# Patient Record
Sex: Male | Born: 2000 | Race: White | Hispanic: Yes | Marital: Single | Smoking: Never smoker
Health system: Southern US, Community
[De-identification: ages and names within clinical notes are randomized; demographics above are authoritative.]

## PROBLEM LIST (undated history)

## (undated) DIAGNOSIS — F419 Anxiety disorder, unspecified: Secondary | ICD-10-CM

## (undated) DIAGNOSIS — F840 Autistic disorder: Secondary | ICD-10-CM

## (undated) DIAGNOSIS — Z8701 Personal history of pneumonia (recurrent): Secondary | ICD-10-CM

## (undated) DIAGNOSIS — F32A Depression, unspecified: Secondary | ICD-10-CM

## (undated) HISTORY — DX: Autistic disorder: F84.0

## (undated) HISTORY — DX: Depression, unspecified: F32.A

## (undated) HISTORY — DX: Anxiety disorder, unspecified: F41.9

---
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
## 2020-09-11 ENCOUNTER — Encounter: Payer: BC Managed Care – PPO | Admitting: Internal Medicine

## 2020-10-07 ENCOUNTER — Encounter: Payer: Self-pay | Admitting: Internal Medicine

## 2021-03-18 ENCOUNTER — Ambulatory Visit (INDEPENDENT_AMBULATORY_CARE_PROVIDER_SITE_OTHER): Payer: BC Managed Care – PPO | Admitting: Internal Medicine

## 2021-03-18 ENCOUNTER — Encounter: Payer: Self-pay | Admitting: Internal Medicine

## 2021-03-18 ENCOUNTER — Other Ambulatory Visit: Payer: Self-pay

## 2021-03-18 VITALS — BP 116/70 | HR 65 | Temp 98.5°F | Ht 69.67 in | Wt 203.0 lb

## 2021-03-18 DIAGNOSIS — Z0000 Encounter for general adult medical examination without abnormal findings: Secondary | ICD-10-CM | POA: Diagnosis not present

## 2021-03-18 DIAGNOSIS — F840 Autistic disorder: Secondary | ICD-10-CM

## 2021-03-18 DIAGNOSIS — I861 Scrotal varices: Secondary | ICD-10-CM

## 2021-03-18 DIAGNOSIS — E559 Vitamin D deficiency, unspecified: Secondary | ICD-10-CM | POA: Diagnosis not present

## 2021-03-18 DIAGNOSIS — J301 Allergic rhinitis due to pollen: Secondary | ICD-10-CM

## 2021-03-18 LAB — CBC WITH DIFFERENTIAL/PLATELET
Basophils Absolute: 0 10*3/uL (ref 0.0–0.1)
Basophils Relative: 0.6 % (ref 0.0–3.0)
Eosinophils Absolute: 0.2 10*3/uL (ref 0.0–0.7)
Eosinophils Relative: 4 % (ref 0.0–5.0)
HCT: 43.1 % (ref 39.0–52.0)
Hemoglobin: 15 g/dL (ref 13.0–17.0)
Lymphocytes Relative: 30.2 % (ref 12.0–46.0)
Lymphs Abs: 1.6 10*3/uL (ref 0.7–4.0)
MCHC: 34.7 g/dL (ref 30.0–36.0)
MCV: 82.7 fl (ref 78.0–100.0)
Monocytes Absolute: 0.4 10*3/uL (ref 0.1–1.0)
Monocytes Relative: 7.6 % (ref 3.0–12.0)
Neutro Abs: 3 10*3/uL (ref 1.4–7.7)
Neutrophils Relative %: 57.6 % (ref 43.0–77.0)
Platelets: 204 10*3/uL (ref 150.0–400.0)
RBC: 5.22 Mil/uL (ref 4.22–5.81)
RDW: 13.4 % (ref 11.5–15.5)
WBC: 5.3 10*3/uL (ref 4.0–10.5)

## 2021-03-18 LAB — COMPREHENSIVE METABOLIC PANEL
ALT: 42 U/L (ref 0–53)
AST: 23 U/L (ref 0–37)
Albumin: 4.7 g/dL (ref 3.5–5.2)
Alkaline Phosphatase: 68 U/L (ref 39–117)
BUN: 13 mg/dL (ref 6–23)
CO2: 28 mEq/L (ref 19–32)
Calcium: 9.6 mg/dL (ref 8.4–10.5)
Chloride: 102 mEq/L (ref 96–112)
Creatinine, Ser: 0.87 mg/dL (ref 0.40–1.50)
GFR: 123.63 mL/min (ref >60.00)
Glucose, Bld: 85 mg/dL (ref 70–99)
Potassium: 3.8 mEq/L (ref 3.5–5.1)
Sodium: 140 mEq/L (ref 135–145)
Total Bilirubin: 0.6 mg/dL (ref 0.2–1.2)
Total Protein: 7.6 g/dL (ref 6.0–8.3)

## 2021-03-18 LAB — LIPID PANEL
Cholesterol: 162 mg/dL (ref 0–200)
HDL: 34.8 mg/dL — ABNORMAL LOW (ref >39.00)
LDL Cholesterol: 109 mg/dL — ABNORMAL HIGH (ref 0–99)
NonHDL: 127.68
Total CHOL/HDL Ratio: 5
Triglycerides: 95 mg/dL (ref 0.0–149.0)
VLDL: 19 mg/dL (ref 0.0–40.0)

## 2021-03-18 LAB — TSH: TSH: 2.05 u[IU]/mL (ref 0.35–5.50)

## 2021-03-18 MED ORDER — TRIAMCINOLONE ACETONIDE 0.5 % EX OINT: 1.0000 "application " | TOPICAL_OINTMENT | Freq: Four times a day (QID) | CUTANEOUS | 3 refills | Status: AC

## 2021-03-18 NOTE — Assessment & Plan Note (Addendum)
Continue on Vit D 

## 2021-03-18 NOTE — Assessment & Plan Note (Signed)
We discussed age appropriate health related issues, including available/recomended screening tests and vaccinations. We discussed a need for adhering to healthy diet and exercise. Labs were reviewed/ordered. All questions were answered. Age and sex related issues discussed (safe sex, seat belt use, etc.). Gardasil suggested, info given.  

## 2021-03-18 NOTE — Assessment & Plan Note (Signed)
Abn [place] in 2017 s/p eval by dr Barnie Del who moved ?Use athletic briefs - Nylon, spandex ?

## 2021-03-18 NOTE — Progress Notes (Signed)
? ?Subjective:  ?Patient ID: Patrick Espinoza, male    DOB: 10/29/2000  Age: 21 y.o. MRN: [id number] ? ?CC: Annual Exam ? ? ?HPI ?Patrick Espinoza presents for a well exam ? 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  He would like to see a psychologist to rule out ADD.  He would like to get tested. ? ?Outpatient Medications Prior to Visit  ?Medication Sig Dispense Refill  ? cetirizine (ZYRTEC) 10 MG tablet TAKE ONE TABLET BY MOUTH ONCE DAILY 90 tablet 3  ? Cholecalciferol (VITAMIN D3) 50 MCG (2000 UT) capsule Take 1 capsule (2,000 Units total) by mouth daily. 100 capsule 3  ? Cholecalciferol (VITAMIN D3) 1.25 MG (50000 UT) CAPS Take 1 capsule by mouth once a week. 6 capsule 0  ? ?No facility-administered medications prior to visit.  ? ? ?ROS: ?Review of Systems  ?Constitutional:  Negative for appetite change, fatigue and unexpected weight change.  ?HENT:  Negative for congestion, nosebleeds, sneezing, sore throat and trouble swallowing.   ?Eyes:  Negative for itching and visual disturbance.  ?Respiratory:  Negative for cough.   ?Cardiovascular:  Negative for chest pain, palpitations and leg swelling.  ?Gastrointestinal:  Negative for abdominal distention, blood in stool, diarrhea and nausea.  ?Genitourinary:  Negative for frequency and hematuria.  ?Musculoskeletal:  Negative for back pain, gait problem, joint swelling and neck pain.  ?Skin:  Negative for rash.  ?Neurological:  Negative for dizziness, tremors, speech difficulty and weakness.  ?Psychiatric/Behavioral:  Negative for agitation, dysphoric mood and sleep disturbance. The patient is not nervous/anxious.   ? ?Objective:  ?BP 116/70 (BP Location: Left Arm, Patient Position: Sitting, Cuff Size: Large)   Pulse 65   Temp 98.5 ?F (36.9 ?C) (Oral)   Ht 5' 9.67" (1.77 m)   Wt 203 lb (92.1 kg)   SpO2 98%   BMI 29.40 kg/m?  ? ?BP Readings from Last 3 Encounters:  ?03/18/21 116/70  ?03/26/19 112/72  ?02/27/18 112/74  ? ? ?Wt Readings from  Last 3 Encounters:  ?03/18/21 203 lb (92.1 kg)  ?03/26/19 168 lb (76.2 kg) (71 %, Z= 0.55)*  ?02/27/18 170 lb (77.1 kg) (78 %, Z= 0.77)*  ? ?* Growth percentiles are based on CDC (Boys, 2-20 Years) data.  ? ? ?Physical Exam ?Constitutional:   ?   General: He is not in acute distress. ?   Appearance: He is well-developed.  ?   Comments: NAD  ?Eyes:  ?   Conjunctiva/sclera: Conjunctivae normal.  ?   Pupils: Pupils are equal, round, and reactive to light.  ?Neck:  ?   Thyroid: No thyromegaly.  ?   Vascular: No JVD.  ?Cardiovascular:  ?   Rate and Rhythm: Normal rate and regular rhythm.  ?   Heart sounds: Normal heart sounds. No murmur heard. ?  No friction rub. No gallop.  ?Pulmonary:  ?   Effort: Pulmonary effort is normal. No respiratory distress.  ?   Breath sounds: Normal breath sounds. No wheezing or rales.  ?Chest:  ?   Chest wall: No tenderness.  ?Abdominal:  ?   General: Bowel sounds are normal. There is no distension.  ?   Palpations: Abdomen is soft. There is no mass.  ?   Tenderness: There is no abdominal tenderness. There is no guarding or rebound.  ?Musculoskeletal:     ?   General: No tenderness. Normal range of motion.  ?   Cervical back: Normal range of motion.  ?Lymphadenopathy:  ?  Cervical: No cervical adenopathy.  ?Skin: ?   General: Skin is warm and dry.  ?   Findings: No rash.  ?Neurological:  ?   Mental Status: He is alert and oriented to person, place, and time.  ?   Cranial Nerves: No cranial nerve deficit.  ?   Motor: No abnormal muscle tone.  ?   Coordination: Coordination normal.  ?   Gait: Gait normal.  ?   Deep Tendon Reflexes: Reflexes are normal and symmetric.  ?Psychiatric:     ?   Behavior: Behavior normal.     ?   Thought Content: Thought content normal.     ?   Judgment: Judgment normal.  ?Fingertips with pink dry skin and cracks ?I spent 22 minutes in addition to time for CPX wellness examination in preparing to see the patient obtaining and reviewing separately obtained history,  communicating with the patient, ordering medications, tests or procedures, and documenting clinical information in the EHR including the differential diagnosis, treatment, and any further evaluation and other management of fingertip eczema, possible ADD.  ?  ?  ? ? ?Lab Results  ?Component Value Date  ? WBC 5.3 03/18/2021  ? HGB 15.0 03/18/2021  ? HCT 43.1 03/18/2021  ? PLT 204.0 03/18/2021  ? GLUCOSE 85 03/18/2021  ? CHOL 162 03/18/2021  ? TRIG 95.0 03/18/2021  ? HDL 34.80 (L) 03/18/2021  ? LDLCALC 109 (H) 03/18/2021  ? ALT 42 03/18/2021  ? AST 23 03/18/2021  ? NA 140 03/18/2021  ? K 3.8 03/18/2021  ? CL 102 03/18/2021  ? CREATININE 0.87 03/18/2021  ? BUN 13 03/18/2021  ? CO2 28 03/18/2021  ? TSH 2.05 03/18/2021  ? ? ?DG Chest 2 View ? ?Result Date: 12/07/2010 ?*RADIOLOGY REPORT* Clinical Data: Cough, fever CHEST - 2 VIEW Comparison: Chest x-ray of 06/01/2007 Findings: There is a left lower lobe opacity with air bronchograms consistent with left lower lobe pneumonia.  The right lung is clear.  Mediastinal contours are stable.  The heart is within normal limits in size.  No acute bony abnormality is seen. IMPRESSION: Left lower lobe pneumonia. Original Report Authenticated By: Juline PatchPAUL D. BARRY, M.D. ? ? ?Assessment & Plan:  ? ?Problem List Items Addressed This Visit   ? ? Allergic rhinitis  ?  Continue with Zyrtec ?  ?  ? Autism  ?  Patrick Espinoza would like to see a psychologist to rule out ADD.  Will arrange. ?  ?  ? Relevant Orders  ? Ambulatory referral to Psychology  ? Varicocele  ?  Abn [place] in 2017 s/p eval by dr Edwin Capuckett who moved ?Use athletic briefs - Nylon, spandex ?  ?  ? Vitamin D deficiency  ?  Continue on Vit D ?  ?  ? Well adult exam - Primary  ?  We discussed age appropriate health related issues, including available/recomended screening tests and vaccinations. We discussed a need for adhering to healthy diet and exercise. Labs were reviewed/ordered. All questions were answered. Age and sex related issues  discussed (safe sex, seat belt use, etc.). Gardasil suggested, info given. ?  ?  ? Relevant Orders  ? TSH (Completed)  ? Urinalysis  ? CBC with Differential/Platelet (Completed)  ? Lipid panel (Completed)  ? Comprehensive metabolic panel (Completed)  ?  ? ? ?Meds ordered this encounter  ?Medications  ? triamcinolone ointment (KENALOG) 0.5 %  ?  Sig: Apply 1 application. topically 4 (four) times daily.  ?  Dispense:  90  g  ?  Refill:  3  ?  ? ? ?Follow-up: Return in about 1 year (around 03/19/2022) for Wellness Exam. ? ?Sonda Primes, MD ?

## 2021-03-22 NOTE — Assessment & Plan Note (Signed)
Continue with Zyrtec

## 2021-03-22 NOTE — Assessment & Plan Note (Signed)
Pal would like to see a psychologist to rule out ADD.  Will arrange. ?

## 2021-06-01 ENCOUNTER — Encounter: Payer: Self-pay | Admitting: Psychology

## 2021-06-01 ENCOUNTER — Ambulatory Visit: Payer: BC Managed Care – PPO | Admitting: Psychology

## 2021-06-01 DIAGNOSIS — F840 Autistic disorder: Secondary | ICD-10-CM

## 2021-06-01 NOTE — Progress Notes (Signed)
[facility] Counselor Initial Adult Exam  Name: Patrick Espinoza Date: 06/01/2021 MRN: [id number] DOB: 03/11/2000 PCP: Tresa Garter, MD  Time spent: 3-3:45pm  Guardian/Informant:  Sedonia Small - patient     Paperwork requested: No   Reason for Visit /Presenting Problem: Patient reported being diagnosed with ASD since age 21.  Been working on that his entire life, but recently wondering if he has ADHD as well.  Thinks many of his ASD symptoms overlap with ADHD.    Mental Status Exam: Appearance:   Neat and Well Groomed     Behavior:  Appropriate and Sharing  Motor:  Restlestness  Speech/Language:   Normal Rate  Affect:  Constricted and Flat  Mood:  euthymic  Thought process:  normal  Thought content:    WNL  Sensory/Perceptual disturbances:    WNL  Orientation:  oriented to person, place, time/date, and situation  Attention:  Fair  Concentration:  Good  Memory:  WNL  Fund of knowledge:   Good  Insight:    Good  Judgment:   Good  Impulse Control:  Good   Reported Symptoms:  Falls asleep easily. No recent changes in appetite.  Energy fluctuates between low and typical.  Some high energy when stressed.  No sadness or depression currently but moire frequent last year.  No current hopelessness or helplessness.  No SH. Has anxiety but no panic. No specific worries or fears.  Has general worry.  Social anxiety situation dependent.  Some obsessive thought - worries about doing activities correctly.  No compulsive behavior.  Trouble paying attention as long as patient can remember.  Easily distracted by phone, objects, in vision, or noises, occasional forgetting, poor organization, frequent restless/fidgety (leg shaking, fingers), frequent impulsive behavior at home.  Can relate to others his own age.  Has some friendships.  Struggles with reading body language.  No repetitive speech or behavior but stutters.  Intense interest in computer activity.   Variable adaptation to  change.  Used to have noise sensitivity (crowds) but can tolerate it now.    Risk Assessment: Danger to Self:  No Self-injurious Behavior: No Danger to Others: No Duty to Warn:no Physical Aggression / Violence:No  Access to Firearms a concern: No  Gang Involvement:No  Patient / guardian was educated about steps to take if suicide or homicide risk level increases between visits: n/a While future psychiatric events cannot be accurately predicted, the patient does not currently require acute inpatient psychiatric care and does not currently meet [facility] involuntary commitment criteria.  Developmental History: Delays in speech and interaction.  First spoke at age 73.   Gross motor - adequate - No current sports or physical activity, but played basketball and soccer in the past.   Fine motor - still has poor handwriting, no other fine motor problems.   Speech - seems age typical other than occasional stutter but tone of voice seems off.   Self-help - needs assistance with physical fitness, community activities, chores, adult responsibilities.  Relies on parents to support self. Social - Used to be closed off from others, but now starting to talk more to others, showing showing interest in them and involved in some clubs at school.       Substance Abuse History: Current substance abuse: No   Some recreational drinking on a few occasions.  Past Psychiatric History:   Previous psychological history is significant for autism Outpatient Providers:Saw a therapist at college for one semester but not currently.  History of Psych Hospitalization: No  Psychological Testing: Autism Spectrum:  Age 14.    Abuse History:  Victim of: No.,  None    Report needed: No. Victim of Neglect:No. Perpetrator of  None   Witness / Exposure to Domestic Violence: No   Protective Services Involvement: No  Witness to [employer] Violence:  No   Family History: No family history on file.  Living situation:  the patient lives with their family (parents, brother - 43).  Have good relations with family but wants to be left alone or lazy at times.  Lashes out on occasion when events do not go his way.     Sexual Orientation: Questioning  Relationship Status: single  Name of spouse / other:None - not dating anyone.  No interest at the times.   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  Working part-time.  Supported financially by parents.  Career goals are to be an Agricultural engineer or job where strengths are emphasized.    Military Service: No   Educational History: Education: some college Currently attending [employer] in Art.  Struggled academically, was close to getting suspended but was able to pull grades up enough to stay actively enrolled.  Struggles with organization and time management.  Difficulty getting work in by deadline.  No current accommodations.  Was in special needs classes through elementary and middle school.    Religion/Sprituality/World View: Catholic  Any cultural differences that may affect / interfere with treatment:  Hispanic   Recreation/Hobbies: music, drawing, reading comics, lost drive to do personal art during the semester.   Stressors: Financial difficulties  , saving money.  Overeating, lack of exercise.  Strengths: Following curriculum and understanding subjects.  Able to ask for help but reluctant to do that in the past.    Barriers:  Low energy   Legal History: Pending legal issue / charges: The patient has no significant history of legal issues. History of legal issue / charges:  None  Medical History/Surgical History: reviewed Past Medical History:  Diagnosis Date   Autism    History of pneumonia 12/07/2010     No past surgical history on file.  Medications: Current Outpatient Medications  Medication Sig Dispense Refill   cetirizine (ZYRTEC) 10 MG tablet TAKE ONE TABLET BY MOUTH ONCE DAILY 90 tablet 3   Cholecalciferol (VITAMIN D3) 1.25 MG (50000 UT) CAPS Take 1 capsule by mouth once a week. 6 capsule 0   Cholecalciferol (VITAMIN D3) 50 MCG (2000 UT) capsule Take 1 capsule (2,000 Units total) by mouth daily. 100 capsule 3   triamcinolone ointment (KENALOG) 0.5 % Apply 1 application. topically 4 (four) times daily. 90 g 3   No current facility-administered medications for this visit.    No Known Allergies - Seasonal allergies. No digestive problems.  No seizures, concussions or HI.  Diagnoses:  Autism spectrum disorder R/O ADHD  Plan of Care: patient was previously diagnosed with autism spectrum disorder at age 49 but reporting difficulty with attention, distractibility, organization, physical restlessness, and impulsivity.  Frequent worry and periods of depressed mood were also reported.  Attention problems interfere with completing schoolwork as well a adult independent activities.  Testing recommended to evaluate for ADHD as update current social-emotional functioning.     Test  [place]  - Virtual WAIS-IV, CNSVS, BRIEF-A, Adult ADHD, DASS, SRS-2 (self & mother).   Bryson DamesSTEVEN Jair Lindblad, PhD

## 2021-06-01 NOTE — Progress Notes (Signed)
                Adonis Yim, PhD 

## 2021-06-15 ENCOUNTER — Encounter: Payer: Self-pay | Admitting: Psychology

## 2021-06-15 ENCOUNTER — Ambulatory Visit: Payer: BC Managed Care – PPO | Admitting: Psychology

## 2021-06-15 DIAGNOSIS — F840 Autistic disorder: Secondary | ICD-10-CM

## 2021-06-15 NOTE — Progress Notes (Signed)
[facility] Testing Progress Note  Patient ID: Patrick Espinoza, MRN: [id number],    Date: 06/15/2021  Time Spent: 12:00 - 2:00pm   Treatment Type: Testing  Met with patient for testing session.  Patient was at home and session was conducted from therapist's office via video conferencing.  Patient verbally consented to telehealth.  Reported Symptoms: Reason for Visit /Presenting Problem: Patient was previously diagnosed with Autism Spectrum Disorder at age 21 but reporting difficulty with attention, distractibility, organization, physical restlessness, and impulsivity.  Frequent worry and periods of depressed mood were also reported.  Attention problems interfere with completing schoolwork as well a adult independent activities.  Testing recommended to evaluate for ADHD as update current social-emotional functioning.     Mental Status Exam: Appearance:  Neat and Well Groomed     Behavior: Appropriate  Motor: Normal  Speech/Language:  Clear and Coherent and Normal Rate  Affect: Restricted  Mood: normal  Thought process: Word retrieval difficulty  Thought content:   WNL  Sensory/Perceptual disturbances:   WNL  Orientation: oriented to person, place, time/date, and situation  Attention: Good  Concentration: Good  Memory: WNL  Fund of knowledge:  Good  Insight:   Fair  Judgment:  Good  Impulse Control: Good   Risk Assessment: Danger to Self:  No Self-injurious Behavior: No Danger to Others: No  Behavior Observations: Patient was cooperative and displayed good effort. Attention and concentration were adequate overall, although patient exhibited several instances each of self-correction and asking questions to be repeated.  He occasionally missed some relatively easy problems or questions.  Mood was euthymic with restricted affect.  The results appear representative of current functioning.    Subjective: Testing included the WAIS-IV (1.25 hrs. for testing and scoring) along  with the CNS Vital signs (0.75 hrs.).  The BRIEF-A was completed by patient online prior to the session.     Diagnosis:Autism spectrum disorder  Plan: Testing complete. Report writing to be conducted followed by interactive feedback next session.  Rainey Pines, PhD

## 2021-06-15 NOTE — Progress Notes (Signed)
                Jocie Meroney, PhD 

## 2021-06-16 ENCOUNTER — Encounter: Payer: Self-pay | Admitting: Psychology

## 2021-06-16 NOTE — Progress Notes (Signed)
Patrick Espinoza is a 21 y.o. male patient. Report writing competed ( 2 hrs.).  Interactive feedback to be conducted next session. Report to be attached to the feedback progress note.       Bryson Dames, PhD

## 2021-07-06 ENCOUNTER — Ambulatory Visit: Payer: BC Managed Care – PPO | Admitting: Psychology

## 2021-07-13 ENCOUNTER — Ambulatory Visit: Payer: BC Managed Care – PPO | Admitting: Psychology

## 2021-07-31 ENCOUNTER — Ambulatory Visit: Payer: BC Managed Care – PPO | Admitting: Psychology

## 2021-08-19 ENCOUNTER — Ambulatory Visit: Payer: BC Managed Care – PPO | Admitting: Psychology

## 2021-08-19 ENCOUNTER — Encounter: Payer: Self-pay | Admitting: Psychology

## 2021-08-19 DIAGNOSIS — F900 Attention-deficit hyperactivity disorder, predominantly inattentive type: Secondary | ICD-10-CM

## 2021-08-19 DIAGNOSIS — F840 Autistic disorder: Secondary | ICD-10-CM | POA: Diagnosis not present

## 2021-08-19 DIAGNOSIS — F331 Major depressive disorder, recurrent, moderate: Secondary | ICD-10-CM

## 2021-08-19 DIAGNOSIS — F411 Generalized anxiety disorder: Secondary | ICD-10-CM | POA: Diagnosis not present

## 2021-08-19 NOTE — Progress Notes (Signed)
Psychological Testing Report - Confidential  Identifying Information:               Patient's Name:   Patrick Espinoza  Date of Birth:   October 04, 2000     Age:                21 years  MRN#:                                   [id number]      Date of Assessment:              June 15, 2021         Purpose of Evaluation:  The purpose of the evaluation is to provide diagnostic information and treatment recommendations.     Referral Information: Mr. Cammack was a 21 year old Hispanic male referred by Jacinta Shoe, MD for testing regarding Attention Deficit Hyperactivity Disorder (ADHD).  Mr. Hulbert reported being diagnosed with Autism Spectrum Disorder ASD since age 57.  He has been working on improving his behavior related to that condition his entire life, but recently began wondering if he has ADHD as well.  He believed that many of his ASD symptoms overlap with ADHD.    Relevant Background Information:  Early delays were reported in speech acquisition and social interaction.  Mr. Granberg first spoke at age 1.  Gross motor skills were reported to be adequately developed.  He does not currently participate in sports or physical activity, but he played basketball and soccer in the past.  Regarding fine motor skills, Mr. Thiam indicated still having poor handwriting but no other fine motor problems.  Speech was reported to be age typical other than an occasional stutter and atypical tone of voice.  Regarding self-help skills, Mr. Cuevas indicated needing assistance with physical fitness, community activities, chores, and adult responsibilities.  He relies on his parents to support him.  Socially, Mr. Rudd reported that he used to be closed off from others, but now is starting to interact more.  He shows more interest in people and is involved in some clubs at school.    Medical history was reported to be significant for Autism and pneumonia (12/07/2010). He denied any hospitalizations or surgeries.   Current medications and supplements include cetirizine (ZYRTEC) 10 MG., Cholecalciferol (VITAMIN D3) 1.25 MG, and triamcinolone ointment (KENALOG) 0.5 %.  Seasonal allergies were reported while digestive problems, seizures, concussions, and head injuries were denied. Previous psychological history is significant for Autism.  Mr. Bramer indicated that he saw a psychotherapist during college for one semester but not currently doing so. A history of psych hospitalization was denied.  Previous psychological testing was for Autism at age 47.  Current substance abuse was denied, as Mr. Barlowe reported some recreational drinking on a few occasions.                                                                                                     Educationally, Mr. Cajamarca reported that  he is currently attending the [place] at [employer] in [place].  He reported struggled academically, being close to getting suspended for low performance, but he was able to raise his grades enough to stay actively enrolled.  Mr. Kil struggles with organization and time management which makes it difficult for him to complete his work by the deadlines.  He does not receive any accommodations.  Mr. Plute reported being in classes for students with special needs throughout elementary and middle school.  Strengths include following the curriculum and understanding subjects.  He can ask for help but was reluctant to do that in the past.  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  He has been working part-time there for almost one year while attending school.  Mr. Capelle is currently supported financially by his parents.  His career goals are to be an Contractor or have a job where his strengths are emphasized.  Leisure activities include music, drawing, and reading comics.  Mr. Kutzer reported that he lost his drive to do personal art during the semester.  Mr. Schudel is currently  living with his parents and brother (62 years).  He reported having good relations with his family but wanting to be left alone at times.  He lashes out on occasion when events do not go his way.  Mr. Squiers reported currently questioning his sexual identity.  He is not dating anyone and has no interest in doing so at this time.  Family mental health history was reported to be unremarkable.  A history of abuse or neglect during childhood was denied.  Current stressors include financial difficulties (trouble saving money), overeating, and lack of exercise.  Presenting Symptomology:  Mr. Kinzer reported that he falls asleep easily and denied recent changes in appetite.  His energy fluctuates between low and typical, with some high energy when stressed.  He denied episodes sadness or depression currently, but these were more frequent last year.  he does not have current feelings of hopelessness or helplessness and denied thoughts of self-harm.  He reported having frequent anxiety but no panic.  He denied specific worries or fears but experiences general worry.  Social anxiety was reported to be situationally dependent.  Some obsessive thought was reported as he worries about doing activities correctly.  Compulsive behavior was denied.  Mr. Salem reported having trouble paying attention for as long as he can remember.  He becomes easily distracted by his phone, objects in his vision, or noises. He will be occasionally forgetful, has poor organization, is frequently restless/fidgety (leg shaking, fingers), and is often impulsive at home.  Mr. Dunner indicated that he relates well to others his own age, and has some friendships, but struggles with reading body language.  He denied repetitive speech or behavior but stutters.  An overly intense interest was reported regarding computer activity.  Adaptation to change was reported to be variable.  He used to have hypersensitivity to noise and crowds but can tolerate these  now.                                     Procedures Administered: Wechsler Adult Intelligence Scale - IV CNS Vital Signs Behavior Rating Inventory of Executive Function Adult ADHD Self Report Scale Depression, Anxiety, and Stress Scale Social Responsiveness Scale -2   Behavioral Observations:  Mr. Lano was cooperative and displayed good effort.  Attention and concentration were adequate overall, although Mr. Kasparian exhibited several instances each of self-correction and asking questions to be repeated.  He occasionally missed some relatively easy problems or questions.  Mood was euthymic with restricted affect.  The results appear representative of current functioning.  Mr. Rowden was neatly dressed and well groomed.  Brief mental status indicated typical general orientation and alertness.  Recent, remote, and immediate memory were intact, with impairment in delayed memory.  Working memory was adequately developed.  Judgement was good with fair insight.     Test Results and Interpretation:   General Intellectual Functioning:  Wechsler Adult Intelligence Scale - IV Composite Score Summary  Composite  Sum of Scaled Scores Composite Score Percentile Rank 90% Confidence Interval Qualitative Description  Verbal Comprehen. VCI 25 91 27 87-106 Average  Perceptual Reason. PRI 27 94 34 80-100 Average  Working Memory WMI 19 97 42 91-103 Average  Processing Speed PSI - - - - -  Full Scale IQ* FSIQ 71 95 37 92-99 Average  *Based on 8 subtests  Domain Subtest Name  Total Raw Score Scaled Score Percentile Rank  Verbal Similarities SI 14   5   5   Comprehension Vocabulary VC 29   9 37   Information IN 15 11 24  Perceptual Reas. Matrix Reasoning MR 13   6   9    Visual Puzzles VP 20 13 84   Figure Weights FW 12   8 25    Picture Comp. PC   4   3   1   Working [employer]. Digit Span DS 29 10 50   Arithmetic AR 13   9 37  Processing Speed Symbol Search SS - - -   Coding CD - - -  The WAIS-IV was used  to assess Mr. Packwood performance across three areas of cognitive ability. When interpreting his scores, it is important to view the results as a snapshot of his current intellectual functioning. As measured by the WAIS-IV, his overall FSIQ score fell in the Average range when compared to other adults his age (FSIQ = 55). He showed age-typical performance on verbal comprehension (VCI = 91), Perceptual Reasoning (PSI = 94) and working memory tasks (WM = 97), indicating typically developed language skills, visual perception, and auditory memory.  On individual subtests, Mr. Ahmed struggled the most on tasks measuring visual pattern recognition, attention to visual detail, and verbal abstract reasoning which were measured to be well below age typical.  Processing speed tasks were administered on a different measure.  Attention and Processing:                                                       CNS Vital Signs   Domain Scores Standard Score %ile Validity Indicator Guideline  Neurocognitive Index 88 21 Yes Low Average  Composite Memory 73 4 Yes Low   Verbal Memory 97 42 Yes Average  Visual Memory 63 1 Yes Very Low   Psychomotor speed 110 75 Yes High Average  Reaction Time 99 47 Yes Average  Complex Attention 68 2 Yes Very Low  Cognitive Flexibility 92 30 Yes Average  Processing Speed 89 23 Yes Low Average  Executive Function 94 34 Yes Average  Working Memory 63 1 No Very Low  Sustained Attention 83 13 No Below Average  Simple Attention  9 1 Yes Very Low  Motor Speed 122 93 Yes High   The results of the CNS Vital Signs testing indicated low average overall neurocognitive processing ability, at a level relatively consistent with measured intellectual ability (average).  Regarding areas related to attention problems, simple and complex attention were very low, while cognitive flexibility and executive function were average and sustained attention was below average.  These are the domains most  closely associated with attention deficits.  Motor/psychomotor speed was high average to high, while reaction time was average and processing speed was low average, indicating mildly slow typical thinking speed with typical responsiveness and fast hand/eye speed on computerized measures.  Visual and working memory were very low with average verbal memory, indicating much better memory for words than multitasking and images.  The results suggest that Mr. Devincenzi appears to have below well age typical ability attending to simple and complex tasks, with adequate executive processing in general.  Memory seems well developed for words but not for other aspects.  All measures were deemed valid, except for working memory and sustained attention.      Executive Function:  BRIEF-A Score Summary Table Scale/Index Raw score T score Percentile  Inhibit 18 70 97  Shift 13 69 96  Emotional Control 24 69 97  Self-Monitor 14 72 99  Behavioral Regulation Index (BRI) 69 74 98  Initiate 19 73 98  Working Memory 20 79 99  Plan/Organize 26 81 >99  Task Monitor 15 77 >99  Organization of Materials 12 47 49  Metacognition Index (MI) 92 74 98  Global Executive Composite (GEC) 161 76 99   Mr. completed the Self-Report Form of the Behavior Rating Inventory of Executive Function-Adult Version (BRIEF-A) on 06/15/2021. There are no missing item responses in the protocol. Ratings of Mr. Gaulin self-regulation do not appear overly negative. Items were completed in a reasonable fashion, suggesting that the respondent did not respond to items in a haphazard or extreme manner. Responses are reasonably consistent. In the context of these validity considerations, ratings of Mr. Ryland everyday executive function suggest some areas of concern. The overall index, the Global Executive Composite (GEC), was elevated (GEC T = 76, %ile = 99). Both the Behavioral Regulation (BRI) and the Metacognition (MI) Indexes were elevated (BRI T =  74, %ile = 98 and MI T = 74, %ile = 98).   Within these summary indicators, all the individual scales are valid. One or more of the individual BRIEF-A scales were elevated, suggesting that Mr. Brooking reports difficulty with some aspects of executive function. Concerns are noted with his ability to inhibit impulsive responses, adjust to changes in routine or task demands, modulate emotions, monitor social behavior, initiate problem solving or activity, sustain working memory, plan and organize problem-solving approaches, and attend to task-oriented output.  Mr. Benningfield ability to organize environment and materials is not described as problematic.   Mr. Rusher scores on the Shift and Emotional Control scales are elevated compared to age-matched peers. This profile suggests significant problem-solving rigidity combined with emotional dysregulation. Individuals with this profile tend to lose emotional control when their routines or perspectives are challenged and/or flexibility is required. Additionally, Mr. Carrington elevated scores on the Inhibit scale, and the Behavioral Regulation and the Metacognition Indexes, suggest that Mr. Pottenger is perceived as having poor inhibitory control and/or suggest that more global behavioral dysregulation is having a negative effect on active metacognitive problem solving.  Behavioral - Emotional Functioning: Ratings of behavior and emotional functioning  indicated much attention and behavior difficulty.  On the Adult ADHD Self Report Scale, Mr. Kessner positively endorsed, as occurring often or very often, 6 of 9 items for intention/poor organization and 4 of 9 items for hyperactivity and poor impulse control.  Additionally, 5 of 6 critical items were highly endorsed including finishing tasks, organizing, and starting tasks along with frequent fidgeting and feeling compelled to move.  Trouble remembering appointments was endorsed as occurring sometimes.  Endorsement of at least 5  items in either category along with 4 critical items is considered at-risk for ADHD.    Ratings for general emotional functioning indicated significant emotional distress.  On the Depression, Anxiety, and Stress Scale, Mr. Petri responses indicated high depressed mood and anxiety and a moderate level of stress.  Ten of the 21 items were highly endorsed on this measure.  Additionally, Mr. Bergren total score on the Social Responsiveness Scale - 2 were within the mildly elevated range. Scores in this range indicate deficiencies in reciprocal social behavior that are clinically significant and may lead to mild to moderate interference with everyday social interactions.  Restricted repetitive behavior was rated with age typical limits at this time.    Summary:   Mr. Merriman was evaluated during June 2023 related to reported attention and organization deficits.  Mr. Matovich was previously diagnosed with Autism Spectrum Disorder at age 89 but is currently reporting difficulty with attention, distractibility, organization, physical restlessness, and impulsivity.  Frequent worry and periods of depressed mood were also reported.  Attention problems interfere with completing schoolwork as well as adult independent activities.  Testing was recommended to evaluate for ADHD as update current social-emotional functioning.  Test results indicated average overall intelligence (WAIS-IV), with relatively equally developed Verbal Comprehension, Nonverbal Reasoning, and Auditory Working Memory.  Neurocognitive skills testing indicated well below age typical attention for simple and complex tasks with a significantly impaired visual memory.  This suggests significant attention and concentration deficits compared to intelligence.  Ratings for executive function indicated a high level of difficulty in this area including clinically significant problems with inhibition, shifting attention, self-awareness, emotional control,  initiation, working memory, planning/organization, and task monitoring.  Ratings for behavior functioning suggested many ADHD-Inattentive related symptoms, with significant levels of depressed mood and general anxiety.  Mr. Dou appears to meet the criterion for ADHD, based on developmental history and current behavior.  Anxiety and depressed mood seem significant while behavior associated with Autism Spectrum Disorder seem mild at this time.  Recommendations include discussing results with Primary Care physician, seeking educational accommodations/assistance, and resuming individual counseling.  See below for further recommendations.         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  Medication for attention deficits should be considered, although caution should be taken when prescribing stimulants due to high levels of physical anxiety/stress.  Medication for anxiety and mood regulation may also be helpful in balancing stimulant effects.  Consider psychiatric care through the [facility].     Individual counseling is recommended to resume to help Mr. Conn with managing his anxiety, regulating his mood, and improving organizational skills.  Due to Mr. Mearns's executive function deficits, he would benefit from a structured therapy approach that focuses  on the teaching of physical calming, cognitive restructuring, mindfulness, and organizational skills. Seek assistance and accommodation through the school regarding time management and work completion.  Additional testing for academic achievement may be needed to access services  through [employer] [facility] [employer].     Mental alertness/energy can also be raised by increasing exercise, improving sleep, eating a healthy diet, and managing depression/stress.  Consult with a physician regarding any changes to physical regimen.  Information and support regarding ASD can be achieved through [facility] as well as locally through [facility] [place] and [facility] in [place], and the [employer] in [place].  Electronics engineer dysfunction can significantly impact an individual's ability to function at home, at school, at work, or in [employer]. Several different approaches to executive function intervention have been developed by neuropsychologists, rehabilitation specialists, and others that are aimed at helping individuals cope with executive dysfunction. One type of intervention involves the application of cognitive remediation techniques that typically emphasize repeated practice with tasks, such as memory and attention tasks, that are intended to improve the deficient skill. This form of intervention has demonstrated some success in treating people with executive dysfunction, such as individuals who have traumatic brain injury.   Another type of intervention involves teaching compensatory strategies. These strategies are designed to circumvent rather than directly improve deficits and also have demonstrated effectiveness in a number of patient populations. Still others emphasize the interaction of the individual within the environment and how antecedent environmental modifications or accommodations can facilitate executive functions. It should be noted that these approaches to dealing with executive dysfunction need not be mutually exclusive and many intervention programs are characterized by a hybrid approach.   Compensatory strategies themselves can take several forms including using external aides (e.g., use of a  notebook), learning cognitive strategies (e.g., verbalization), and making environmental modifications (e.g., keeping workspace clutter-free). Research has demonstrated that both healthy adults as well as individuals who have executive deficits commonly rely on external aids for executive and other cognitive processes. The probability of success with compensatory strategies can be enhanced by building on an individual's existing strategies, systematically training the new strategies, and tailoring the compensatory strategies to the individual's unique needs and environmental contexts. More frequent use of aides or strategies and the use of a greater variety of aides is helpful when it comes to memory, and this also may hold true for executive dysfunction.   For individuals with more severe executive dysfunction and/or those with additional deficits in other domains of functioning, such as memory and learning, assimilating and applying compensatory strategies and aides may be difficult. Providing such individuals with a high degree of external support can help them successfully complete tasks with less error and improve self-esteem. Prolonged reliance on external support without any systematic plan for developing some degree of independent skill, however, may interfere with the individual's ability to learn from new experiences. In many cases, across the range of severity, behavioral change may best be achieved through supportive practice of routines within pertinent "natural" contexts such as the home, where fostering the development of behaviors and thoughts that are elicited by regular cues in the environment is facilitated. This form of compensatory strategy relies on habit formation, also referred to as implicit memory or procedural learning, aspects of which are relatively intact in many conditions where executive dysfunction is common.  For individuals who have very severe cognitive dysfunction, instructing  someone other than the individual  in question (e.g., caregiver, spouse, Pharmacist, hospital, supervisor) on appropriate environmental modifications may be the most helpful approach.  In the context of a systematic approach, some suggested compensatory strategies for dealing with executive dysfunction follow. These recommendations are generic in nature and can be tailored to individual needs based on severity of deficit, preserved strengths, and environmental demands.       Revonda Standard Danamarie Minami, Ph.D. Licensed Psychologist - [facility] [place] Licensed psychologist [phone]                  Rainey Pines, PhD

## 2021-08-19 NOTE — Progress Notes (Signed)
[facility] Counselor/Therapist Progress Note  Patient ID: Patrick Espinoza, MRN: [id number],    Date: 08/19/2021  Time Spent: 3-3;45 pm   Treatment Type:  Testing - Feedback Session  Met with patient to review results of testing.  Patient was at home and session was conducted from therapist's office via video conferencing.  Patient verbally consented to telehealth.       Reported Symptoms: Patient was previously diagnosed with Autism Spectrum Disorder at age 21 but reporting difficulty with attention, distractibility, organization, physical restlessness, and impulsivity.  Frequent worry and periods of depressed mood were also reported.  Attention problems interfere with completing schoolwork as well a adult independent activities.  Testing recommended to evaluate for ADHD as update current social-emotional functioning.     Subjective: Interactive feedback was conducted (1 hr.).  It was discussed how patient met the criterion for Autism Spectrum Disorder and ADHD along with how these conditions affect his ability to learn and relate to others.      Recommendations included discussing results with PCP, developing a visual organization system, and resuming individual counseling to help anxiety and depressed mood.  Patient expressed agreement with the results and recommendations.     Total Time of Testing: 5 hrs. Testing and Scoring: 2 hrs. Interactive Feedback:1 hr. Report Writing: 2 hrs.   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      Patrick Pines, PhD

## 2021-08-19 NOTE — Progress Notes (Signed)
                Patrick Borrelli, PhD 

## 2023-02-08 ENCOUNTER — Ambulatory Visit (INDEPENDENT_AMBULATORY_CARE_PROVIDER_SITE_OTHER): Payer: 59 | Admitting: Internal Medicine

## 2023-02-08 ENCOUNTER — Encounter: Payer: Self-pay | Admitting: Internal Medicine

## 2023-02-08 VITALS — BP 110/66 | HR 92 | Temp 98.6°F | Ht 69.67 in | Wt 210.4 lb

## 2023-02-08 DIAGNOSIS — E559 Vitamin D deficiency, unspecified: Secondary | ICD-10-CM

## 2023-02-08 DIAGNOSIS — L700 Acne vulgaris: Secondary | ICD-10-CM | POA: Diagnosis not present

## 2023-02-08 DIAGNOSIS — F840 Autistic disorder: Secondary | ICD-10-CM | POA: Diagnosis not present

## 2023-02-08 DIAGNOSIS — Z0000 Encounter for general adult medical examination without abnormal findings: Secondary | ICD-10-CM | POA: Diagnosis not present

## 2023-02-08 DIAGNOSIS — L709 Acne, unspecified: Secondary | ICD-10-CM | POA: Insufficient documentation

## 2023-02-08 DIAGNOSIS — F988 Other specified behavioral and emotional disorders with onset usually occurring in childhood and adolescence: Secondary | ICD-10-CM | POA: Insufficient documentation

## 2023-02-08 DIAGNOSIS — R41840 Attention and concentration deficit: Secondary | ICD-10-CM

## 2023-02-08 MED ORDER — B COMPLEX-C PO TABS: 1.0000 | ORAL_TABLET | Freq: Every day | ORAL | Status: AC

## 2023-02-08 MED ORDER — DOXYCYCLINE HYCLATE 100 MG PO TABS: 100.0000 mg | ORAL_TABLET | Freq: Two times a day (BID) | ORAL | 2 refills | Status: DC

## 2023-02-08 NOTE — Assessment & Plan Note (Addendum)
Psychology ref Pt saw Dr Reggy Eye in 06/2021

## 2023-02-08 NOTE — Assessment & Plan Note (Addendum)
Worse.  Re-start Vit D by prescription followed by over-the-counter vitamin D

## 2023-02-08 NOTE — Assessment & Plan Note (Signed)
Worse Start Doxy 100 mg bid Derm ref

## 2023-02-08 NOTE — Assessment & Plan Note (Signed)
We discussed age appropriate health related issues, including available/recomended screening tests and vaccinations. We discussed a need for adhering to healthy diet and exercise. Labs were reviewed/ordered. All questions were answered. Age and sex related issues discussed (safe sex, seat belt use, etc.). Gardasil suggested, info given.

## 2023-02-08 NOTE — Assessment & Plan Note (Addendum)
Sevyn would like to see a psychologist to rule out ADD. Pt saw Dr Reggy Eye in 06/2021

## 2023-02-08 NOTE — Progress Notes (Signed)
Subjective:  Patient ID: Patrick Espinoza, male    DOB: [birth year]  Age: 23 y.o. MRN: [id number]  CC: Annual Exam (Annual Exam)   HPI Patrick Espinoza presents for a well exam C/o acne - back, chest, pubic area C/o autism since 23 yo, c/o possible ADHD In college - art, animation; failed 2 classes Pt saw Dr Reggy Eye in 06/2021  Outpatient Medications Prior to Visit  Medication Sig Dispense Refill   cetirizine (ZYRTEC) 10 MG tablet TAKE ONE TABLET BY MOUTH ONCE DAILY 90 tablet 3   Cholecalciferol (VITAMIN D3) 50 MCG (2000 UT) capsule Take 1 capsule (2,000 Units total) by mouth daily. 100 capsule 3   Cholecalciferol (VITAMIN D3) 1.25 MG (50000 UT) CAPS Take 1 capsule by mouth once a week. 6 capsule 0   No facility-administered medications prior to visit.    ROS: Review of Systems  Constitutional:  Negative for appetite change, fatigue and unexpected weight change.  HENT:  Negative for congestion, nosebleeds, sneezing, sore throat and trouble swallowing.   Eyes:  Negative for itching and visual disturbance.  Respiratory:  Negative for cough.   Cardiovascular:  Negative for chest pain, palpitations and leg swelling.  Gastrointestinal:  Negative for abdominal distention, blood in stool, diarrhea and nausea.  Genitourinary:  Negative for frequency and hematuria.  Musculoskeletal:  Negative for back pain, gait problem, joint swelling and neck pain.  Skin:  Positive for rash.  Neurological:  Negative for dizziness, tremors, speech difficulty and weakness.  Psychiatric/Behavioral:  Positive for decreased concentration. Negative for agitation, dysphoric mood, sleep disturbance and suicidal ideas. The patient is nervous/anxious.     Objective:  BP 110/66   Pulse 92   Temp 98.6 F (37 C)   Ht 5' 9.67" (1.77 m)   Wt 210 lb 6.4 oz (95.4 kg)   SpO2 97%   BMI 30.48 kg/m   BP Readings from Last 3 Encounters:  02/08/23 110/66  03/18/21 116/70  03/26/19 112/72    Wt Readings from Last 3  Encounters:  02/08/23 210 lb 6.4 oz (95.4 kg)  03/18/21 203 lb (92.1 kg)  03/26/19 168 lb (76.2 kg) (71%, Z= 0.55)*   * Growth percentiles are based on CDC (Boys, 2-20 Years) data.    Physical Exam Constitutional:      General: He is not in acute distress.    Appearance: He is well-developed. He is obese.     Comments: NAD  Eyes:     Conjunctiva/sclera: Conjunctivae normal.     Pupils: Pupils are equal, round, and reactive to light.  Neck:     Thyroid: No thyromegaly.     Vascular: No JVD.  Cardiovascular:     Rate and Rhythm: Normal rate and regular rhythm.     Heart sounds: Normal heart sounds. No murmur heard.    No friction rub. No gallop.  Pulmonary:     Effort: Pulmonary effort is normal. No respiratory distress.     Breath sounds: Normal breath sounds. No wheezing or rales.  Chest:     Chest wall: No tenderness.  Abdominal:     General: Bowel sounds are normal. There is no distension.     Palpations: Abdomen is soft. There is no mass.     Tenderness: There is no abdominal tenderness. There is no guarding or rebound.  Musculoskeletal:        General: No tenderness. Normal range of motion.     Cervical back: Normal range of motion.  Lymphadenopathy:  Cervical: No cervical adenopathy.  Skin:    General: Skin is warm and dry.     Findings: No rash.  Neurological:     Mental Status: He is alert and oriented to person, place, and time.     Cranial Nerves: No cranial nerve deficit.     Motor: No abnormal muscle tone.     Coordination: Coordination normal.     Gait: Gait normal.     Deep Tendon Reflexes: Reflexes are normal and symmetric.  Psychiatric:        Behavior: Behavior normal.        Thought Content: Thought content normal.        Judgment: Judgment normal.    Acne on back, chest, pubic area.  Most extensive area of involvement is his upper back The patient is alert oriented and cooperative  Lab Results  Component Value Date   WBC 6.9 02/08/2023    HGB 15.3 02/08/2023   HCT 45.2 02/08/2023   PLT 225.0 02/08/2023   GLUCOSE 93 02/08/2023   CHOL 200 02/08/2023   TRIG 279.0 (H) 02/08/2023   HDL 35.30 (L) 02/08/2023   LDLCALC 109 (H) 02/08/2023   ALT 65 (H) 02/08/2023   AST 32 02/08/2023   NA 138 02/08/2023   K 3.9 02/08/2023   CL 100 02/08/2023   CREATININE 0.82 02/08/2023   BUN 10 02/08/2023   CO2 26 02/08/2023   TSH 2.52 02/08/2023    DG Chest 2 View Result Date: 12/07/2010 *RADIOLOGY REPORT* Clinical Data: Cough, fever CHEST - 2 VIEW Comparison: Chest x-ray of 06/01/2007 Findings: There is a left lower lobe opacity with air bronchograms consistent with left lower lobe pneumonia.  The right lung is clear.  Mediastinal contours are stable.  The heart is within normal limits in size.  No acute bony abnormality is seen. IMPRESSION: Left lower lobe pneumonia. Original Report Authenticated By: Juline Patch, M.D.   Assessment & Plan:   Problem List Items Addressed This Visit     Well adult exam - Primary   We discussed age appropriate health related issues, including available/recomended screening tests and vaccinations. We discussed a need for adhering to healthy diet and exercise. Labs were reviewed/ordered. All questions were answered. Age and sex related issues discussed (safe sex, seat belt use, etc.). Gardasil suggested, info given.      Relevant Medications   B Complex-C (B-COMPLEX WITH VITAMIN C) tablet   Other Relevant Orders   TSH (Completed)   Urinalysis (Completed)   CBC with Differential/Platelet (Completed)   Lipid panel (Completed)   Comprehensive metabolic panel (Completed)   Vitamin B12 (Completed)   VITAMIN D 25 Hydroxy (Vit-D Deficiency, Fractures) (Completed)   Vitamin D deficiency   Worse.  Re-start Vit D by prescription followed by over-the-counter vitamin D      Autism   Patrick Espinoza would like to see a psychologist to rule out ADD. Pt saw Dr Reggy Eye in 06/2021      Relevant Orders   Ambulatory  referral to Psychology   Acne   Worse Start Doxy 100 mg bid Derm ref      Relevant Medications   doxycycline (VIBRA-TABS) 100 MG tablet   ADD (attention deficit disorder)   Psychology ref Pt saw Dr Reggy Eye in 06/2021      Relevant Orders   Ambulatory referral to Psychology   Vitamin B12 (Completed)   VITAMIN D 25 Hydroxy (Vit-D Deficiency, Fractures) (Completed)      Meds ordered this encounter  Medications   doxycycline (VIBRA-TABS) 100 MG tablet    Sig: Take 1 tablet (100 mg total) by mouth 2 (two) times daily.    Dispense:  60 tablet    Refill:  2   B Complex-C (B-COMPLEX WITH VITAMIN C) tablet    Sig: Take 1 tablet by mouth daily.      Follow-up: Return in about 2 months (around 04/08/2023) for Wellness Exam.  Sonda Primes, MD

## 2023-02-09 LAB — COMPREHENSIVE METABOLIC PANEL
ALT: 65 U/L — ABNORMAL HIGH (ref 0–53)
AST: 32 U/L (ref 0–37)
Albumin: 4.6 g/dL (ref 3.5–5.2)
Alkaline Phosphatase: 64 U/L (ref 39–117)
BUN: 10 mg/dL (ref 6–23)
CO2: 26 meq/L (ref 19–32)
Calcium: 9.2 mg/dL (ref 8.4–10.5)
Chloride: 100 meq/L (ref 96–112)
Creatinine, Ser: 0.82 mg/dL (ref 0.40–1.50)
GFR: 124.19 mL/min (ref >60.00)
Glucose, Bld: 93 mg/dL (ref 70–99)
Potassium: 3.9 meq/L (ref 3.5–5.1)
Sodium: 138 meq/L (ref 135–145)
Total Bilirubin: 0.3 mg/dL (ref 0.2–1.2)
Total Protein: 7.7 g/dL (ref 6.0–8.3)

## 2023-02-09 LAB — CBC WITH DIFFERENTIAL/PLATELET
Basophils Absolute: 0.1 10*3/uL (ref 0.0–0.1)
Basophils Relative: 1 % (ref 0.0–3.0)
Eosinophils Absolute: 0.4 10*3/uL (ref 0.0–0.7)
Eosinophils Relative: 5.2 % — ABNORMAL HIGH (ref 0.0–5.0)
HCT: 45.2 % (ref 39.0–52.0)
Hemoglobin: 15.3 g/dL (ref 13.0–17.0)
Lymphocytes Relative: 33.6 % (ref 12.0–46.0)
Lymphs Abs: 2.3 10*3/uL (ref 0.7–4.0)
MCHC: 33.8 g/dL (ref 30.0–36.0)
MCV: 84 fL (ref 78.0–100.0)
Monocytes Absolute: 0.5 10*3/uL (ref 0.1–1.0)
Monocytes Relative: 7.6 % (ref 3.0–12.0)
Neutro Abs: 3.6 10*3/uL (ref 1.4–7.7)
Neutrophils Relative %: 52.6 % (ref 43.0–77.0)
Platelets: 225 10*3/uL (ref 150.0–400.0)
RBC: 5.37 Mil/uL (ref 4.22–5.81)
RDW: 13.3 % (ref 11.5–15.5)
WBC: 6.9 10*3/uL (ref 4.0–10.5)

## 2023-02-09 LAB — LIPID PANEL
Cholesterol: 200 mg/dL (ref 0–200)
HDL: 35.3 mg/dL — ABNORMAL LOW (ref >39.00)
LDL Cholesterol: 109 mg/dL — ABNORMAL HIGH (ref 0–99)
NonHDL: 164.5
Total CHOL/HDL Ratio: 6
Triglycerides: 279 mg/dL — ABNORMAL HIGH (ref 0.0–149.0)
VLDL: 55.8 mg/dL — ABNORMAL HIGH (ref 0.0–40.0)

## 2023-02-09 LAB — URINALYSIS
Bilirubin Urine: NEGATIVE
Ketones, ur: NEGATIVE
Leukocytes,Ua: NEGATIVE
Nitrite: NEGATIVE
Specific Gravity, Urine: 1.01 (ref 1.000–1.030)
Total Protein, Urine: NEGATIVE
Urine Glucose: NEGATIVE
Urobilinogen, UA: 0.2 (ref 0.0–1.0)
pH: 6 (ref 5.0–8.0)

## 2023-02-09 LAB — VITAMIN B12: Vitamin B-12: 537 pg/mL (ref 211–911)

## 2023-02-09 LAB — TSH: TSH: 2.52 u[IU]/mL (ref 0.35–5.50)

## 2023-02-09 LAB — VITAMIN D 25 HYDROXY (VIT D DEFICIENCY, FRACTURES): VITD: 17.2 ng/mL — ABNORMAL LOW (ref 30.00–100.00)

## 2023-02-13 ENCOUNTER — Encounter: Payer: Self-pay | Admitting: Internal Medicine

## 2023-02-14 ENCOUNTER — Encounter: Payer: Self-pay | Admitting: Internal Medicine

## 2023-02-17 ENCOUNTER — Ambulatory Visit: Payer: 59 | Admitting: Psychology

## 2023-02-17 ENCOUNTER — Encounter: Payer: Self-pay | Admitting: Psychology

## 2023-02-17 DIAGNOSIS — F840 Autistic disorder: Secondary | ICD-10-CM | POA: Diagnosis not present

## 2023-02-17 DIAGNOSIS — F331 Major depressive disorder, recurrent, moderate: Secondary | ICD-10-CM | POA: Diagnosis not present

## 2023-02-17 DIAGNOSIS — F900 Attention-deficit hyperactivity disorder, predominantly inattentive type: Secondary | ICD-10-CM | POA: Diagnosis not present

## 2023-02-17 DIAGNOSIS — F411 Generalized anxiety disorder: Secondary | ICD-10-CM

## 2023-02-17 NOTE — Progress Notes (Signed)
   Bryson Dames, PhD

## 2023-02-17 NOTE — Progress Notes (Signed)
 [facility] Counselor Initial Adult Exam  Name: Patrick Espinoza Date: 02/17/2023 MRN: [id number] DOB: 2000-10-30 PCP: Garald Karlynn GAILS, MD  Time spent: 1:30 - 2:20 pm  Informant/Payee:  Anton Andrews - patient    Paperwork requested: No  Met with patient for initial interview.  Patient was at the clinic and session was conducted from the therapist's office in person.    Reason for Visit /Presenting Problem: Patient was a former testing patient, wanting to restart therapy again.  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  He has not done much to try to improve these difficulties and seems unmotivated.  He also reported having much anxiety, which contributes to his avoidance.    Mental Status Exam: Appearance:   Casual and Well Groomed     Behavior:  Appropriate and Sharing  Motor:  Restlestness  Speech/Language:   Clear and Coherent and Normal Rate  Affect:  Constricted and Flat  Mood:  euthymic  Thought process:  normal  Thought content:    WNL  Sensory/Perceptual disturbances:    WNL  Orientation:  oriented to person, place, time/date, and situation  Attention:  Good  Concentration:  Fair  Memory:  WNL  Fund of knowledge:   Good  Insight:    Fair  Judgment:   Good  Impulse Control:  Good   Developmental History: Early delays - Was late with speaking (4 years) and had difficulty with behavior regulation and focus, which improved over time.   Motor -Good motor coordination (slight motor tics).  No current sports or exercise but wants to get back into being more physically active.   Speech - Stutters frequently but otherwise can communicate well.   Self Care - Good  Independent - Has a driver's license but typically takes the bus.  He has a job and is currently in process of applying for another job.Still lives with his parents while attending college. Social - trouble reading people's emotions or  knowing how to react to people.  Often over-reacts to minor upsetting events.     Reported Symptoms:  Trouble regulating sleep schedule.  No recent changes in appetite.  Tends to eat a lot in general.  Energy fluctuates during the day.  Has depressive episodes for 1-2 days at a time.  Becomes upset 2-3 times per week, especially when he listens to the news.  This can interrupt his work flow for college.  Last time was yesterday.  No sudden anxiety/panic.  Anxiety is more subtle for him.  Worries about events happening too quickly for him or if he is stressing others by his actions.  Has general worry and social anxiety.  Highly anxious when in crowds and close spaces.  No intrusive thought.  No compulsive behavior.  Trouble paying attention.  Easily distracted.  No losing but some forgetting.  Organization is difficult.  Frequently restless but not bothered by it.  Interrupts others but not on purpose.  Frequent impulsive behavior.  Relates adequately to peers. Gets jokes but trouble reading sarcasm.  Can read others socially sometimes, other times will ask.  Has a few friends, some closer than others. Spends most of time by himself.  Recites lines from movies and TV repetitive to self (stress reliever).  No overly intense interests.  Response to change & transition is inconsistent.  Used to sensory hypersensitivities ( loud noise) but can tolerate that now.  Risk Assessment: Danger to Self:  No Self-injurious Behavior: No Danger to Others: No Duty to Warn:no Physical Aggression / Violence:No  Access to Firearms a concern: No  Gang Involvement:No  Patient / guardian was educated about steps to take if suicide or homicide risk level increases between visits: n/a While future psychiatric events cannot be accurately predicted, the patient does not currently require acute inpatient psychiatric care and does not currently meet   involuntary commitment criteria.  Substance Abuse  History: Current substance abuse: No     Past Psychiatric History:   Previous psychological history is significant for ADHD, anxiety, autism, and depression Outpatient Providers:Saw a therapist at [facility] for a semester during Spring 2024.   History of Psych Hospitalization: No  Psychological Testing: seen by this provider during June 2023 and diagnosed with ADHD, generalized anxiety, depression, and ASD.     Abuse History:  Victim of: No.,  None    Report needed: No. Victim of Neglect:No. Perpetrator of  None   Witness / Exposure to Domestic Violence: No   Protective Services Involvement: No  Witness to [employer] Violence:  No   Family History: No family history on file. None  Living situation: the patient lives with their family (parents and 60 year old brother).  Adequately family relations - has conflicts related to not thinking before speaking.  Patient feels bad afterwards.      Sexual Orientation: Pansexual CIS gender but has been thinking about gender fluidity.  Has little to minimal interest in dating.    Relationship Status: single  Name of spouse / other:Not dating any.  Never had a serious relationship.   If a parent, number of children / ages:None  Support Systems: parents Mother, aunt, and one of art teachers.  Financial Stress:   Has had trouble saving money in the past but improving now.    Income/Employment/Disability: Supported by [employer] while in college but works part-time as an government social research officer at the [employer].  Does well on the job.  Applying for a second job as only has one class per day and graduates this coming May.    Military Service: No   Educational History: Education: student Currently at [employer].  In last semester currently studying animation.   Does adequately in school.  Working in midwife.    Religion/Sprituality/World View: Catholic  Any cultural differences that may affect / interfere with treatment:  Hispanic  Recreation/Hobbies: Listening to music, going for long walks, talking to brother about shared interests and reading comic books.    Stressors: Educational concerns   Behind on some assignments  Strengths: Follows directions well, completes household chores.  Gets along with others better than previously.      Barriers:  Thoughts that activities are too difficulty or that people do not like him leading to avoidance of both activities and social interaction.     Legal History: Pending legal issue / charges: The patient has no significant history of legal issues.  Medical History/Surgical History: reviewed Past Medical History:  Diagnosis Date   Anxiety 08/03/20   Autism    Depression 08/03/20   History of pneumonia 12/07/2010  No current medical problems  No past surgical history on file.  Medications: Current Outpatient Medications  Medication Sig Dispense Refill   B Complex-C (B-COMPLEX WITH VITAMIN C) tablet Take 1 tablet by mouth daily.     cetirizine  (ZYRTEC ) 10 MG tablet TAKE ONE TABLET BY MOUTH ONCE DAILY 90 tablet 3   Cholecalciferol (  VITAMIN D3) 50 MCG (2000 UT) capsule Take 1 capsule (2,000 Units total) by mouth daily. 100 capsule 3   doxycycline  (VIBRA -TABS) 100 MG tablet Take 1 tablet (100 mg total) by mouth 2 (two) times daily. 60 tablet 2   No current facility-administered medications for this visit.  Currently taking acne and allergy medication along with vitamin D .  No Known Allergies No digestion problems.  No concussions seizures or head injuries.    Diagnoses:  Generalized anxiety disorder  Major depressive disorder, recurrent episode, moderate (HCC)  Attention deficit hyperactivity disorder (ADHD), predominantly inattentive type  Autism spectrum disorder  Plan of Care: Patient presents with excessive worry/anxiety with intermoittent depressed mood.  He has much difficulty orgsanizing his thoughts and activity which concerns him as he is  expecting to graduate college this spring.  He was previously evaluated by this provider and diagnosed with ADHD and autism spectrum as well as generalized anxiety and depression.  Patient currently seeking psychotherapy to learn the coping skills needed to help navigate through adult life after graduating from college.      Goal: Better regulate anxiety and lessen avoidance of important activity.  Objective: Patient to practice physical calming exercises during 80% of the days. Target date: 07/11/2023 Progress: 0  Patient to notice negative thoughts about self that are either exaggerated, unlikely to happen, or out of his control during 80% of the instances. Target date: 10/11/2023 Progress: 0  Goal: Increase positive thinking and motivation  Objective: Positive to say or write down one positive aspect of himself or his life during 80% of days. Target date: 01/11/2024 Progress: 0    Xoe Hoe, PhD

## 2023-03-10 ENCOUNTER — Ambulatory Visit: Payer: Self-pay | Admitting: Psychology

## 2023-03-17 ENCOUNTER — Ambulatory Visit: Payer: 59 | Admitting: Psychology

## 2023-04-28 ENCOUNTER — Encounter: Payer: Self-pay | Admitting: Psychology

## 2023-04-28 ENCOUNTER — Ambulatory Visit (INDEPENDENT_AMBULATORY_CARE_PROVIDER_SITE_OTHER): Payer: 59 | Admitting: Psychology

## 2023-04-28 DIAGNOSIS — F411 Generalized anxiety disorder: Secondary | ICD-10-CM

## 2023-04-28 DIAGNOSIS — F840 Autistic disorder: Secondary | ICD-10-CM | POA: Diagnosis not present

## 2023-04-28 DIAGNOSIS — F331 Major depressive disorder, recurrent, moderate: Secondary | ICD-10-CM

## 2023-04-28 NOTE — Progress Notes (Signed)
 [facility] Counselor/Therapist Progress Note  Patient ID: Patrick Espinoza, MRN: [id number],    Date: 04/28/2023  Time Spent: 8:40 - 9:25 am   Treatment Type: Individual Therapy Met with patient for therapy session.  Patient was at the clinic and session was conducted from therapist's office in person.    Reported Symptoms: Patient presents with excessive worry/anxiety with intermittent depressed mood. He has much difficulty organizing his thoughts and activity which concerns him as he is expecting to graduate college this spring. He was previously evaluated by this provider and diagnosed with ADHD and autism spectrum as well as generalized anxiety and depression. Psychotherapy recommended to help patient learn the coping skills needed to help navigate through adult life after graduating from college.  Current concerns were reported regarding his difficulty adjusting to mistakes and not maintaining positive behavior changes.      Mental Status Exam: Appearance:  Neat and Well Groomed     Behavior: Appropriate and Sharing  Motor: Restlestness  Speech/Language:  Clear and Coherent and Normal Rate  Affect: Constricted and Flat  Mood: euthymic  Thought process: concrete  Thought content:   WNL  Sensory/Perceptual disturbances:   WNL  Orientation: oriented to person, place, time/date, and situation  Attention: Good  Concentration: Good  Memory: WNL  Fund of knowledge:  Good  Insight:   Fair  Judgment:  Good  Impulse Control: Good   Risk Assessment: Danger to Self:  No Self-injurious Behavior: No Danger to Others: No Duty to Warn:no Physical Aggression / Violence:No  Access to Firearms a concern: No  Espinoza Involvement:No   Subjective: Patient indicated feeling frustrated about his difficulty adjusting to mistakes, as he continues to do the same behaviors (e.g., staying up late and missing early classes or appointments) and having the same negative consequences.  He  indicated having much difficulty moving away from routine behavior patterns, even when he knows they are not helpful.  He also reported much difficulty sticking with new behavior patterns when he tries to change (stating that he will change but then not following through).    Interventions: Cognitive Behavioral Therapy, Mindfulness Meditation, and Acceptance and Commitment Therapy .  Emphasis was on using physical calming procedures to put himself in a state where he can then commit to activities that he knows are helpful even when they make him uncomfortable due to newness. A sleep monitoring system was given to patient to help with tracking.      Assessment: Patient was receptive to intervention strategies but will need more frequent sessions to become comfortable using them.    Diagnosis:Generalized anxiety disorder  Major depressive disorder, recurrent episode, moderate (HCC)  Autism spectrum disorder  Plan: regular therapy sessions recommended to help patient better manage anxiety and depressed mood, along with better navigating adult life activities.    Goal: Better regulate anxiety and lessen avoidance of important activity.   Objective: Patient to practice physical calming exercises during 80% of the days. Target date: 07/11/2023 Progress: 10   Patient to notice negative thoughts about self that are either exaggerated, unlikely to happen, or out of his control during 80% of the instances. Target date: 10/11/2023 Progress: 10   Goal: Increase positive thinking and motivation   Objective: Positive to say or write down one positive aspect of himself or his life during 80% of days. Target date: 01/11/2024 Progress: 5  Bryson Dames, PhD

## 2023-04-28 NOTE — Progress Notes (Signed)
   Patrick Dames, PhD

## 2023-05-25 ENCOUNTER — Encounter: Payer: Self-pay | Admitting: Psychology

## 2023-05-25 ENCOUNTER — Ambulatory Visit (INDEPENDENT_AMBULATORY_CARE_PROVIDER_SITE_OTHER): Payer: 59 | Admitting: Psychology

## 2023-05-25 DIAGNOSIS — F331 Major depressive disorder, recurrent, moderate: Secondary | ICD-10-CM

## 2023-05-25 DIAGNOSIS — F840 Autistic disorder: Secondary | ICD-10-CM | POA: Diagnosis not present

## 2023-05-25 DIAGNOSIS — F411 Generalized anxiety disorder: Secondary | ICD-10-CM

## 2023-05-25 NOTE — Progress Notes (Signed)
  Behavioral Health Counselor/Therapist Progress Note  Patient ID: Finneas Stengel, MRN: [id number],    Date: 05/25/2023  Time Spent: 8:30 - 9:20 am   Treatment Type: Individual Therapy Met with patient for therapy session.  Patient was at the clinic and session was conducted from therapist's office in person.    Reported Symptoms: Patient presents with excessive worry/anxiety with intermittent depressed mood. He has much difficulty organizing his thoughts and activity which concerns him as he is expecting to graduate college this spring. He was previously evaluated by this provider and diagnosed with ADHD and autism spectrum as well as generalized anxiety and depression. Psychotherapy recommended to help patient learn the coping skills needed to help navigate through adult life after graduating from college.  Current concerns were reported regarding his overly critical thinking of himself and others.      Mental Status Exam: Appearance:  Neat and Well Groomed     Behavior: Appropriate and Sharing  Motor: Appropriate  Speech/Language:  Clear and Coherent and Normal Rate  Affect: Constricted and Flat  Mood: euthymic  Thought process: concrete  Thought content:   WNL  Sensory/Perceptual disturbances:   WNL  Orientation: oriented to person, place, time/date, and situation  Attention: Good  Concentration: Good  Memory: WNL  Fund of knowledge:  Good  Insight:   Fair  Judgment:  Good  Impulse Control: Good   Risk Assessment: Danger to Self:  No Self-injurious Behavior: No Danger to Others: No Duty to Warn:no Physical Aggression / Violence:No  Access to Firearms a concern: No  Gang Involvement:No   Subjective: Patient indicated feeling graduated from college becoming overly anxious during the presentation of his capstone project as he believed it was not as good as the other students and he felt like he did not deserve to be there.  Interventions: Cognitive Behavioral Therapy,  Mindfulness Meditation, and Acceptance and Commitment Therapy.  Emphasis was on being aware of the overly critical thinking and practicing non-judgment along with consciously inserting positive thoughts.     Assessment: Patient was receptive to intervention strategies but will need to practice new thinking methods for them to become more of a habit.  Diagnosis:Generalized anxiety disorder  Major depressive disorder, recurrent episode, moderate (HCC)  Autism spectrum disorder  Plan: Regular therapy sessions recommended to help patient better manage anxiety and depressed mood, along with better navigating adult life activities.    Goal: Better regulate anxiety and lessen avoidance of important activity.   Objective: Patient to practice physical calming exercises during 80% of the days. Target date: 07/11/2023 Progress: 30   Patient to notice negative thoughts about self that are either exaggerated, unlikely to happen, or out of his control during 80% of the instances. Target date: 10/11/2023 Progress: 20   Goal: Increase positive thinking and motivation   Objective: Positive to say or write down one positive aspect of himself or his life during 80% of days. Target date: 01/11/2024 Progress: 10  Maisa Bedingfield, PhD                  Ules Marsala, PhD

## 2023-06-14 ENCOUNTER — Ambulatory Visit (INDEPENDENT_AMBULATORY_CARE_PROVIDER_SITE_OTHER): Admitting: Psychology

## 2023-06-14 ENCOUNTER — Encounter: Payer: Self-pay | Admitting: Psychology

## 2023-06-14 DIAGNOSIS — F411 Generalized anxiety disorder: Secondary | ICD-10-CM | POA: Diagnosis not present

## 2023-06-14 DIAGNOSIS — F840 Autistic disorder: Secondary | ICD-10-CM | POA: Diagnosis not present

## 2023-06-14 NOTE — Progress Notes (Signed)
 [facility] Counselor/Therapist Progress Note  Patient ID: Patrick Espinoza, MRN: [id number],    Date: 06/14/2023  Time Spent: 8:30 - 9:15 am   Treatment Type: Individual Therapy Met with patient for therapy session.  Patient was at the clinic and session was conducted from therapist's office in person.    Reported Symptoms: Patient presents with excessive worry/anxiety with intermittent depressed mood. He has much difficulty organizing his thoughts and activity which concerns him as he is expecting to graduate college this spring. He was previously evaluated by this provider and diagnosed with ADHD and autism spectrum as well as generalized anxiety and depression. Psychotherapy recommended to help patient learn the coping skills needed to help navigate through adult life after graduating from college.  Current concerns were reported regarding his worries about starting a new job and feeling overwhelmed at work.      Mental Status Exam: Appearance:  Neat and Well Groomed     Behavior: Appropriate and Sharing  Motor: Appropriate  Speech/Language:  Clear and Coherent and Normal Rate  Affect: Constricted and Flat  Mood: euthymic  Thought process: concrete  Thought content:   WNL  Sensory/Perceptual disturbances:   WNL  Orientation: oriented to person, place, time/date, and situation  Attention: Good  Concentration: Good  Memory: WNL  Fund of knowledge:  Good  Insight:   Fair  Judgment:  Good  Impulse Control: Good   Risk Assessment: Danger to Self:  No Self-injurious Behavior: No Danger to Others: No Duty to Warn:no Physical Aggression / Violence:No  Access to Firearms a concern: No  Gang Involvement:No   Subjective: Patient indicated feeling excited but anxious about a new job he will be starting tomorrow, 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  He indicated feeling most worried about the length of the shifts (12 hours) and the start time for the shifts (6:00 am).   He also reported getting overwhelmed at his other part time job at the [employer] due to the larger than normal crowd.    Interventions: Cognitive Behavioral Therapy, Mindfulness Meditation, and Acceptance and Commitment Therapy.  Emphasis was on being aware of his worries and assessing if they are realistic, focusing on past accomplishment and what he can control.  Assessment: Patient will need to avoid negative self judgement while learning the new job along with bing able to prioritize which tasks are most important and urgent.     Diagnosis:Generalized anxiety disorder  Autism spectrum disorder  Plan: Regular therapy sessions recommended to help patient better manage anxiety and depressed mood, along with better navigating adult life activities.    Goal: Better regulate anxiety and lessen avoidance of important activity.   Objective: Patient to practice physical calming exercises during 80% of the days. Target date: 07/11/2023 Progress: 40   Patient to notice negative thoughts about self that are either exaggerated, unlikely to happen, or out of his control during 80% of the instances. Target date: 10/11/2023 Progress: 25   Goal: Increase positive thinking and motivation   Objective: Positive to say or write down one positive aspect of himself or his life during 80% of days. Target date: 01/11/2024 Progress: 15  Kalkidan Caudell, PhD                                 Yuritza Paulhus, PhD

## 2023-06-24 ENCOUNTER — Ambulatory Visit
Admission: EM | Admit: 2023-06-24 | Discharge: 2023-06-24 | Disposition: A | Discharge status: Home / Self Care | Attending: Family Medicine | Admitting: Family Medicine

## 2023-06-24 ENCOUNTER — Ambulatory Visit: Payer: Self-pay

## 2023-06-24 DIAGNOSIS — J069 Acute upper respiratory infection, unspecified: Secondary | ICD-10-CM | POA: Diagnosis not present

## 2023-06-24 DIAGNOSIS — R062 Wheezing: Secondary | ICD-10-CM

## 2023-06-24 DIAGNOSIS — J4521 Mild intermittent asthma with (acute) exacerbation: Secondary | ICD-10-CM

## 2023-06-24 LAB — POC SARS CORONAVIRUS 2 AG -  ED: SARS Coronavirus 2 Ag: NEGATIVE

## 2023-06-24 MED ORDER — BENZONATATE 200 MG PO CAPS
200.0000 mg | ORAL_CAPSULE | Freq: Three times a day (TID) | ORAL | 0 refills | Status: AC | PRN
Start: 2023-06-24 — End: ?

## 2023-06-24 MED ORDER — IPRATROPIUM-ALBUTEROL 0.5-2.5 (3) MG/3ML IN SOLN
3.0000 mL | Freq: Once | RESPIRATORY_TRACT | Status: AC
  Administered 2023-06-24: 3 mL via RESPIRATORY_TRACT

## 2023-06-24 MED ORDER — ALBUTEROL SULFATE HFA 108 (90 BASE) MCG/ACT IN AERS: 1.0000 | INHALATION_SPRAY | Freq: Four times a day (QID) | RESPIRATORY_TRACT | 0 refills | Status: AC | PRN

## 2023-06-24 NOTE — ED Triage Notes (Signed)
 Pt present with c/o sob and productive cough x two days. Pt states he has asthma and has not had a flare up in ten years. Pt does not have an inhaler at home that he uses.   Pt states he has taken dayquil yesterday

## 2023-06-24 NOTE — ED Provider Notes (Signed)
 [facility]    CSN: [id number] Arrival date & time: 06/24/23  1303      History   Chief Complaint Chief Complaint  Patient presents with   Shortness of Breath   Cough    HPI Patrick Espinoza is a 23 y.o. male  presents for evaluation of URI symptoms for 2 days. Patient reports associated symptoms of cough, congestion, chest tightness with shortness of breath. Denies N/V/D, sore throat, fevers, ear pain, body aches. Patient does have a hx of asthma.  States he has not had an issue with his asthma since he was a teenager.  Does not currently have an inhaler.  Patient is not an active smoker.   Reports no sick contacts.  Pt has taken DayQuil OTC for symptoms. Pt has no other concerns at this time.    Shortness of Breath Associated symptoms: cough and wheezing   Cough Associated symptoms: shortness of breath and wheezing     Past Medical History:  Diagnosis Date   Anxiety 08/03/20   Autism    Depression 08/03/20   History of pneumonia 12/07/2010    Patient Active Problem List   Diagnosis Date Noted   Acne 02/08/2023   ADD (attention deficit disorder) 02/08/2023   Autism 03/18/2021   Vitamin D  deficiency 03/27/2019   Allergic rhinitis 03/26/2019   Well adult exam 02/27/2018   Varicocele 02/27/2018   Anxiety 02/27/2018    History reviewed. No pertinent surgical history.     Home Medications    Prior to Admission medications   Medication Sig Start Date End Date Taking? Authorizing Provider  albuterol (VENTOLIN HFA) 108 (90 Base) MCG/ACT inhaler Inhale 1-2 puffs into the lungs every 6 (six) hours as needed. 06/24/23  Yes Novella Abraha, Jodi R, NP  benzonatate (TESSALON) 200 MG capsule Take 1 capsule (200 mg total) by mouth 3 (three) times daily as needed. 06/24/23  Yes Bhumi Godbey, Jodi R, NP  B Complex-C (B-COMPLEX WITH VITAMIN C) tablet Take 1 tablet by mouth daily. 02/08/23   Plotnikov, Oakley Bellman, MD  cetirizine  (ZYRTEC ) 10 MG tablet TAKE ONE TABLET BY MOUTH ONCE DAILY  03/26/19   Plotnikov, Aleksei V, MD  Cholecalciferol (VITAMIN D3) 50 MCG (2000 UT) capsule Take 1 capsule (2,000 Units total) by mouth daily. 03/26/19   Plotnikov, Oakley Bellman, MD  doxycycline  (VIBRA -TABS) 100 MG tablet Take 1 tablet (100 mg total) by mouth 2 (two) times daily. 02/08/23   Plotnikov, Oakley Bellman, MD    Family History History reviewed. No pertinent family history.  Social History Social History   Tobacco Use   Smoking status: Never   Smokeless tobacco: Never  Substance Use Topics   Alcohol use: Never   Drug use: Never     Allergies   Patient has no known allergies.   Review of Systems Review of Systems  HENT:  Positive for congestion.   Respiratory:  Positive for cough, chest tightness, shortness of breath and wheezing.      Physical Exam Triage Vital Signs ED Triage Vitals  Encounter Vitals Group     BP 06/24/23 1343 134/83     Girls Systolic BP Percentile --      Girls Diastolic BP Percentile --      Boys Systolic BP Percentile --      Boys Diastolic BP Percentile --      Pulse Rate 06/24/23 1343 86     Resp 06/24/23 1343 20     Temp 06/24/23 1343 98.6 F (37 C)  Temp Source 06/24/23 1343 Oral     SpO2 06/24/23 1343 91 %     Weight --      Height --      Head Circumference --      Peak Flow --      Pain Score 06/24/23 1342 7     Pain Loc --      Pain Education --      Exclude from Growth Chart --    No data found.  Updated Vital Signs BP 134/83   Pulse 86   Temp 98.6 F (37 C) (Oral)   Resp 20   SpO2 96%   Visual Acuity Right Eye Distance:   Left Eye Distance:   Bilateral Distance:    Right Eye Near:   Left Eye Near:    Bilateral Near:     Physical Exam Vitals and nursing note reviewed.  Constitutional:      General: He is not in acute distress.    Appearance: Normal appearance. He is not ill-appearing or toxic-appearing.  HENT:     Head: Normocephalic and atraumatic.     Right Ear: Tympanic membrane and ear canal normal.      Left Ear: Tympanic membrane and ear canal normal.     Nose: Congestion present.     Mouth/Throat:     Mouth: Mucous membranes are moist.     Pharynx: No posterior oropharyngeal erythema.   Eyes:     Pupils: Pupils are equal, round, and reactive to light.    Cardiovascular:     Rate and Rhythm: Normal rate and regular rhythm.     Heart sounds: Normal heart sounds.  Pulmonary:     Effort: Pulmonary effort is normal.     Breath sounds: Wheezing present.   Musculoskeletal:     Cervical back: Normal range of motion and neck supple.  Lymphadenopathy:     Cervical: No cervical adenopathy.   Skin:    General: Skin is warm and dry.   Neurological:     General: No focal deficit present.     Mental Status: He is alert and oriented to person, place, and time.   Psychiatric:        Mood and Affect: Mood normal.        Behavior: Behavior normal.      UC Treatments / Results  Labs (all labs ordered are listed, but only abnormal results are displayed) Labs Reviewed  POC SARS CORONAVIRUS 2 AG -  ED    EKG   Radiology No results found.  Procedures Procedures (including critical care time)  Medications Ordered in UC Medications  ipratropium-albuterol (DUONEB) 0.5-2.5 (3) MG/3ML nebulizer solution 3 mL (3 mLs Nebulization Given 06/24/23 1355)    Initial Impression / Assessment and Plan / UC Course  I have reviewed the triage vital signs and the nursing notes.  Pertinent labs & imaging results that were available during my care of the patient were reviewed by me and considered in my medical decision making (see chart for details).     Reviewed exam and symptoms with patient and dad.  No red flags.  Negative COVID testing.  Discussed viral illness and symptomatic treatment.  Tessalon as needed for cough.  Albuterol inhaler as needed.  Patient declined steroids.  Advised rest fluids and PCP follow-up 2 to 3 days for recheck.  ER precautions reviewed and patient verbalized  understanding. Final Clinical Impressions(s) / UC Diagnoses   Final diagnoses:  Wheezing  Viral upper  respiratory illness  Mild intermittent asthma with acute exacerbation     Discharge Instructions      You tested negative for COVID-19.  May take Tessalon 3 times a day as needed for your cough.  Albuterol inhaler as needed for wheezing or shortness of breath.  Lots of rest and fluids.  Please follow-up with your PCP in 2 to 3 days for recheck.  Please go to the ER for any worsening symptoms.  I hope you feel better soon!     ED Prescriptions     Medication Sig Dispense Auth. Provider   albuterol (VENTOLIN HFA) 108 (90 Base) MCG/ACT inhaler Inhale 1-2 puffs into the lungs every 6 (six) hours as needed. 1 each Thelonious Kauffmann, Jodi R, NP   benzonatate (TESSALON) 200 MG capsule Take 1 capsule (200 mg total) by mouth 3 (three) times daily as needed. 20 capsule Lea Baine, Jodi R, NP      PDMP not reviewed this encounter.   Alleen Arbour, NP 06/24/23 [phone]

## 2023-06-24 NOTE — Discharge Instructions (Signed)
 You tested negative for COVID-19.  May take Tessalon 3 times a day as needed for your cough.  Albuterol inhaler as needed for wheezing or shortness of breath.  Lots of rest and fluids.  Please follow-up with your PCP in 2 to 3 days for recheck.  Please go to the ER for any worsening symptoms.  I hope you feel better soon!

## 2023-06-24 NOTE — Telephone Encounter (Signed)
 FYI Only or Action Required?: FYI only for provider  Patient was last seen in primary care on 02/08/2023 by Plotnikov, Oakley Bellman, MD. Called Nurse Triage reporting Shortness of Breath. Symptoms began yesterday. Interventions attempted: Nothing. Symptoms are: gradually worsening.  Triage Disposition: See HCP Within 4 Hours (Or PCP Triage)  Patient/caregiver understands and will follow disposition?: Yes  Copied from CRM [id number]. Topic: Clinical - Red Word Triage >> Jun 24, 2023 12:23 PM Kita Perish H wrote: [facility] that prompted transfer to Nurse Triage: Having shortness of breath Reason for Disposition  [1] MILD difficulty breathing (e.g., minimal/no SOB at rest, SOB with walking, pulse <100) AND [2] NEW-onset or WORSE than normal  Answer Assessment - Initial Assessment Questions 1. RESPIRATORY STATUS: Describe your breathing? (e.g., wheezing, shortness of breath, unable to speak, severe coughing)      Productive cough, unsure of color because he swallows. Possibly some wheezing 2. ONSET: When did this breathing problem begin?      Two days ago 3. PATTERN Does the difficult breathing come and go, or has it been constant since it started?      Comes and goes 4. SEVERITY: How bad is your breathing? (e.g., mild, moderate, severe)    - MILD: No SOB at rest, mild SOB with walking, speaks normally in sentences, can lie down, no retractions, pulse < 100.    - MODERATE: SOB at rest, SOB with minimal exertion and prefers to sit, cannot lie down flat, speaks in phrases, mild retractions, audible wheezing, pulse 100-120.    - SEVERE: Very SOB at rest, speaks in single words, struggling to breathe, sitting hunched forward, retractions, pulse > 120      Normally ok at rest, but some SOB with activity and cough 5. RECURRENT SYMPTOM: Have you had difficulty breathing before? If Yes, ask: When was the last time? and What happened that time?      One other time months ago, but resolved after one  day 6. CARDIAC HISTORY: Do you have any history of heart disease? (e.g., heart attack, angina, bypass surgery, angioplasty)      denies 7. LUNG HISTORY: Do you have any history of lung disease?  (e.g., pulmonary embolus, asthma, emphysema)     Childhood asthma 8. CAUSE: What do you think is causing the breathing problem?      unknown 9. OTHER SYMPTOMS: Do you have any other symptoms? (e.g., dizziness, runny nose, cough, chest pain, fever)     Denies all symptoms 10. O2 SATURATION MONITOR:  Do you use an oxygen saturation monitor (pulse oximeter) at home? If Yes, ask: What is your reading (oxygen level) today? What is your usual oxygen saturation reading? (e.g., 95%)       Does not own 12. TRAVEL: Have you traveled out of the country in the last month? (e.g., travel history, exposures)       denies  Protocols used: Breathing Difficulty-A-AH

## 2023-07-01 ENCOUNTER — Other Ambulatory Visit: Payer: Self-pay | Admitting: Internal Medicine

## 2023-07-20 ENCOUNTER — Ambulatory Visit (INDEPENDENT_AMBULATORY_CARE_PROVIDER_SITE_OTHER): Admitting: Psychology

## 2023-07-20 ENCOUNTER — Encounter: Payer: Self-pay | Admitting: Psychology

## 2023-07-20 DIAGNOSIS — F411 Generalized anxiety disorder: Secondary | ICD-10-CM | POA: Diagnosis not present

## 2023-07-20 DIAGNOSIS — F840 Autistic disorder: Secondary | ICD-10-CM

## 2023-07-20 NOTE — Progress Notes (Signed)
 [facility] Counselor/Therapist Progress Note  Patient ID: Patrick Espinoza, MRN: [id number],    Date: 07/20/2023  Time Spent: 8:45 - 9:30 am   Treatment Type: Individual Therapy Met with patient for therapy session.  Patient was at the clinic and session was conducted from therapist's office in person.    Reported Symptoms: Patient presents with excessive worry/anxiety with intermittent depressed mood. He has much difficulty organizing his thoughts and activity which concerns him as he is expecting to graduate college this spring. He was previously evaluated by this provider and diagnosed with ADHD and autism spectrum as well as generalized anxiety and depression. Psychotherapy recommended to help patient learn the coping skills needed to help navigate through adult life after graduating from college.  Current concerns were reported regarding his life/career direction after losing his first full-time job.      Mental Status Exam: Appearance:  Neat and Well Groomed     Behavior: Appropriate and Sharing  Motor: Appropriate  Speech/Language:  Clear and Coherent and Normal Rate  Affect: Congruent  Mood: Sad  Thought process: concrete  Thought content:   WNL  Sensory/Perceptual disturbances:   WNL  Orientation: oriented to person, place, time/date, and situation  Attention: Good  Concentration: Good  Memory: WNL  Fund of knowledge:  Good  Insight:   Fair  Judgment:  Good  Impulse Control: Good   Risk Assessment: Danger to Self:  No Self-injurious Behavior: No Danger to Others: No Duty to Warn:no Physical Aggression / Violence:No  Access to Firearms a concern: No  Gang Involvement:No   Subjective: Patient indicated feeling sad after losing his job at [employer].  He experienced difficulty breathing while working in [employer] and could not keep up with the productivity requirements.  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  Additionally, his father is pressuring him to take a job in [employer] where his father works. 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      Interventions: Cognitive Behavioral Therapy, Mindfulness Meditation, and Acceptance and Commitment Therapy.  Emphasis was on accepting his current circumstances while developing a communicating his plans for developing a career path.  Emphasis was on being willing to have uncomfortable feelings and conversations,using his coping skills to keep from getting overwhelmed.    Assessment: Patient will need think longer term to keep from becoming too upset with these setbacks.    Diagnosis:Generalized anxiety disorder  Autism spectrum disorder  Plan: Regular therapy sessions recommended to help patient better manage anxiety and depressed mood, along with better navigating adult life activities.  Objective target date to be extended as further progress is needed.   Goal: Better regulate anxiety and lessen avoidance of important activity.   Objective: Patient to practice physical calming exercises during 80% of the days. Target date: 01/11/2024 Progress: 45   Patient to notice negative thoughts about self that are either exaggerated, unlikely to happen, or out of his control during 80% of the instances. Target date: 10/11/2023 Progress: 30   Goal: Increase positive thinking and motivation   Objective: Positive to say or write down one positive aspect of himself or his life during 80% of days. Target date: 01/11/2024 Progress: 20  Citlalic Norlander, PhD  Khalifa Knecht, PhD

## 2023-08-21 ENCOUNTER — Other Ambulatory Visit: Payer: Self-pay | Admitting: Internal Medicine

## 2023-11-30 ENCOUNTER — Other Ambulatory Visit: Payer: Self-pay | Admitting: Internal Medicine

## 2023-12-06 ENCOUNTER — Ambulatory Visit: Admitting: Internal Medicine

## 2023-12-06 ENCOUNTER — Encounter: Payer: Self-pay | Admitting: Internal Medicine

## 2023-12-06 VITALS — BP 112/68 | HR 71 | Temp 98.0°F | Ht 69.67 in | Wt 208.0 lb

## 2023-12-06 DIAGNOSIS — E785 Hyperlipidemia, unspecified: Secondary | ICD-10-CM | POA: Diagnosis not present

## 2023-12-06 DIAGNOSIS — R41840 Attention and concentration deficit: Secondary | ICD-10-CM | POA: Diagnosis not present

## 2023-12-06 DIAGNOSIS — E559 Vitamin D deficiency, unspecified: Secondary | ICD-10-CM | POA: Diagnosis not present

## 2023-12-06 DIAGNOSIS — E291 Testicular hypofunction: Secondary | ICD-10-CM | POA: Diagnosis not present

## 2023-12-06 DIAGNOSIS — F419 Anxiety disorder, unspecified: Secondary | ICD-10-CM | POA: Diagnosis not present

## 2023-12-06 LAB — COMPREHENSIVE METABOLIC PANEL WITH GFR
ALT: 35 U/L (ref 0–53)
AST: 23 U/L (ref 0–37)
Albumin: 4.9 g/dL (ref 3.5–5.2)
Alkaline Phosphatase: 62 U/L (ref 39–117)
BUN: 13 mg/dL (ref 6–23)
CO2: 33 meq/L — ABNORMAL HIGH (ref 19–32)
Calcium: 9.7 mg/dL (ref 8.4–10.5)
Chloride: 103 meq/L (ref 96–112)
Creatinine, Ser: 0.85 mg/dL (ref 0.40–1.50)
GFR: 122.14 mL/min (ref >60.00)
Glucose, Bld: 86 mg/dL (ref 70–99)
Potassium: 3.6 meq/L (ref 3.5–5.1)
Sodium: 141 meq/L (ref 135–145)
Total Bilirubin: 0.5 mg/dL (ref 0.2–1.2)
Total Protein: 7.9 g/dL (ref 6.0–8.3)

## 2023-12-06 LAB — TESTOSTERONE: Testosterone: 282.61 ng/dL — ABNORMAL LOW (ref 300.00–890.00)

## 2023-12-06 LAB — TSH: TSH: 0.98 u[IU]/mL (ref 0.35–5.50)

## 2023-12-06 LAB — VITAMIN D 25 HYDROXY (VIT D DEFICIENCY, FRACTURES): VITD: 17.61 ng/mL — ABNORMAL LOW (ref 30.00–100.00)

## 2023-12-06 MED ORDER — BUPROPION HCL ER (XL) 150 MG PO TB24: 150.0000 mg | ORAL_TABLET | Freq: Every day | ORAL | 5 refills | Status: AC

## 2023-12-06 NOTE — Assessment & Plan Note (Signed)
 Worse.  Re-start Vit D by prescription followed by over-the-counter vitamin D

## 2023-12-06 NOTE — Assessment & Plan Note (Addendum)
 Psychology ref Pt saw Dr Loel in 06/2021 Discussed Start Wellbutrun XL 150 mg qam

## 2023-12-06 NOTE — Progress Notes (Signed)
 Subjective:  Patient ID: Patrick Espinoza, male    DOB: 23-Dec-2000  Age: 23 y.o. MRN: [id number]  CC: Medical Management of Chronic Issues (Discuss health, improving health, vaccine reconciliation. Chest pains when eating unhealthily)   HPI Patrick Espinoza presents for anxiety, depression Walking for exercise C/o having a very good appetite, worried about wt gain   Outpatient Medications Prior to Visit  Medication Sig Dispense Refill   albuterol  (VENTOLIN  HFA) 108 (90 Base) MCG/ACT inhaler Inhale 1-2 puffs into the lungs every 6 (six) hours as needed. 1 each 0   B Complex-C (B-COMPLEX WITH VITAMIN C) tablet Take 1 tablet by mouth daily.     benzonatate  (TESSALON ) 200 MG capsule Take 1 capsule (200 mg total) by mouth 3 (three) times daily as needed. 20 capsule 0   cetirizine  (ZYRTEC ) 10 MG tablet TAKE ONE TABLET BY MOUTH ONCE DAILY 90 tablet 3   Cholecalciferol (VITAMIN D3) 50 MCG (2000 UT) capsule Take 1 capsule (2,000 Units total) by mouth daily. 100 capsule 3   doxycycline  (VIBRA -TABS) 100 MG tablet Take 1 tablet by mouth twice daily 60 tablet 1   No facility-administered medications prior to visit.    ROS: Review of Systems  Constitutional:  Negative for appetite change, fatigue and unexpected weight change.  HENT:  Negative for congestion, nosebleeds, sneezing, sore throat and trouble swallowing.   Eyes:  Negative for itching and visual disturbance.  Respiratory:  Negative for cough.   Cardiovascular:  Negative for chest pain, palpitations and leg swelling.  Gastrointestinal:  Negative for abdominal distention, blood in stool, diarrhea and nausea.  Genitourinary:  Negative for frequency and hematuria.  Musculoskeletal:  Negative for back pain, gait problem, joint swelling and neck pain.  Skin:  Negative for rash.  Neurological:  Negative for dizziness, tremors, speech difficulty and weakness.  Psychiatric/Behavioral:  Positive for decreased concentration and dysphoric mood.  Negative for agitation, sleep disturbance and suicidal ideas. The patient is nervous/anxious.     Objective:  BP 112/68   Pulse 71   Temp 98 F (36.7 C)   Ht 5' 9.67 (1.77 m)   Wt 208 lb (94.3 kg)   SpO2 96%   BMI 30.13 kg/m   BP Readings from Last 3 Encounters:  12/06/23 112/68  06/24/23 134/83  02/08/23 110/66    Wt Readings from Last 3 Encounters:  12/06/23 208 lb (94.3 kg)  02/08/23 210 lb 6.4 oz (95.4 kg)  03/18/21 203 lb (92.1 kg)    Physical Exam Constitutional:      General: He is not in acute distress.    Appearance: Normal appearance. He is well-developed.     Comments: NAD  Eyes:     Conjunctiva/sclera: Conjunctivae normal.     Pupils: Pupils are equal, round, and reactive to light.  Neck:     Thyroid : No thyromegaly.     Vascular: No JVD.  Cardiovascular:     Rate and Rhythm: Normal rate and regular rhythm.     Heart sounds: Normal heart sounds. No murmur heard.    No friction rub. No gallop.  Pulmonary:     Effort: Pulmonary effort is normal. No respiratory distress.     Breath sounds: Normal breath sounds. No wheezing or rales.  Chest:     Chest wall: No tenderness.  Abdominal:     General: Bowel sounds are normal. There is no distension.     Palpations: Abdomen is soft. There is no mass.     Tenderness: There is  no abdominal tenderness. There is no guarding or rebound.  Musculoskeletal:        General: No tenderness. Normal range of motion.     Cervical back: Normal range of motion.  Lymphadenopathy:     Cervical: No cervical adenopathy.  Skin:    General: Skin is warm and dry.     Findings: No rash.  Neurological:     Mental Status: He is alert and oriented to person, place, and time.     Cranial Nerves: No cranial nerve deficit.     Motor: No abnormal muscle tone.     Coordination: Coordination normal.     Gait: Gait normal.     Deep Tendon Reflexes: Reflexes are normal and symmetric.  Psychiatric:        Behavior: Behavior normal.         Thought Content: Thought content normal.        Judgment: Judgment normal.     Lab Results  Component Value Date   WBC 6.9 02/08/2023   HGB 15.3 02/08/2023   HCT 45.2 02/08/2023   PLT 225.0 02/08/2023   GLUCOSE 86 12/06/2023   CHOL 200 02/08/2023   TRIG 279.0 (H) 02/08/2023   HDL 35.30 (L) 02/08/2023   LDLCALC 109 (H) 02/08/2023   ALT 35 12/06/2023   AST 23 12/06/2023   NA 141 12/06/2023   K 3.6 12/06/2023   CL 103 12/06/2023   CREATININE 0.85 12/06/2023   BUN 13 12/06/2023   CO2 33 (H) 12/06/2023   TSH 0.98 12/06/2023    No results found.  Assessment & Plan:   Problem List Items Addressed This Visit     ADD (attention deficit disorder)   Psychology ref Pt saw Dr Loel in 06/2021 Discussed Start Wellbutrun XL 150 mg qam      Relevant Orders   Comprehensive metabolic panel with GFR (Completed)   TSH (Completed)   Anxiety   Discussed Start Wellbutrun XL 150 mg qam       Relevant Medications   buPROPion  (WELLBUTRIN  XL) 150 MG 24 hr tablet   Other Relevant Orders   Testosterone  (Completed)   Hypogonadism in male   New.  Lose weight.  Diet/supplement recommendations were made Recheck in 6-8 weeks.  Obtain LH, FSH      Vitamin D  deficiency - Primary   Worse.  Re-start Vit D by prescription followed by over-the-counter vitamin D       Relevant Orders   VITAMIN D  25 Hydroxy (Vit-D Deficiency, Fractures) (Completed)   Testosterone  (Completed)   Other Visit Diagnoses       Dyslipidemia       Relevant Orders   Comprehensive metabolic panel with GFR (Completed)   TSH (Completed)         Meds ordered this encounter  Medications   buPROPion  (WELLBUTRIN  XL) 150 MG 24 hr tablet    Sig: Take 1 tablet (150 mg total) by mouth daily.    Dispense:  30 tablet    Refill:  5    Take in am      Follow-up: Return in about 3 months (around 03/07/2024) for a follow-up visit.  Marolyn Noel, MD

## 2023-12-06 NOTE — Assessment & Plan Note (Signed)
 Discussed Start Wellbutrun XL 150 mg qam

## 2023-12-08 ENCOUNTER — Ambulatory Visit: Payer: Self-pay | Admitting: Internal Medicine

## 2023-12-08 DIAGNOSIS — E291 Testicular hypofunction: Secondary | ICD-10-CM | POA: Insufficient documentation

## 2023-12-08 MED ORDER — VITAMIN D (ERGOCALCIFEROL) 1.25 MG (50000 UNIT) PO CAPS: 50000.0000 [IU] | ORAL_CAPSULE | ORAL | 0 refills | Status: AC

## 2023-12-08 NOTE — Assessment & Plan Note (Addendum)
 Repeat lab work in 2 months -FSH, LH, testosterone .  See my device attached to lab work

## 2023-12-10 NOTE — Assessment & Plan Note (Signed)
 New.  Lose weight.  Diet/supplement recommendations were made Recheck in 6-8 weeks.  Obtain LH, FSH

## 2023-12-15 ENCOUNTER — Other Ambulatory Visit: Payer: Self-pay | Admitting: Internal Medicine

## 2024-02-09 ENCOUNTER — Encounter: Admitting: Internal Medicine

## 2024-03-21 ENCOUNTER — Encounter: Admitting: Internal Medicine
